# Patient Record
Sex: Female | Born: 1965
Health system: Southern US, Community
[De-identification: ages and names within clinical notes are randomized; demographics above are authoritative.]

## PROBLEM LIST (undated history)

## (undated) DIAGNOSIS — J329 Chronic sinusitis, unspecified: Secondary | ICD-10-CM

## (undated) DIAGNOSIS — D649 Anemia, unspecified: Secondary | ICD-10-CM

## (undated) DIAGNOSIS — N814 Uterovaginal prolapse, unspecified: Secondary | ICD-10-CM

## (undated) DIAGNOSIS — E785 Hyperlipidemia, unspecified: Secondary | ICD-10-CM

## (undated) DIAGNOSIS — E782 Mixed hyperlipidemia: Secondary | ICD-10-CM

## (undated) DIAGNOSIS — M1990 Unspecified osteoarthritis, unspecified site: Secondary | ICD-10-CM

## (undated) DIAGNOSIS — N393 Stress incontinence (female) (male): Secondary | ICD-10-CM

## (undated) HISTORY — PX: TONSILLECTOMY: SUR1361

## (undated) HISTORY — DX: Mixed hyperlipidemia: E78.2

## (undated) HISTORY — DX: Chronic sinusitis, unspecified: J32.9

## (undated) HISTORY — DX: Hyperlipidemia, unspecified: E78.5

## (undated) HISTORY — DX: Uterovaginal prolapse, unspecified: N81.4

## (undated) HISTORY — PX: NECK LESION BIOPSY: SHX2078

## (undated) HISTORY — DX: Anemia, unspecified: D64.9

## (undated) HISTORY — DX: Stress incontinence (female) (male): N39.3

## (undated) HISTORY — DX: Unspecified osteoarthritis, unspecified site: M19.90

---

## 1997-12-27 ENCOUNTER — Inpatient Hospital Stay (HOSPITAL_COMMUNITY): Admission: AD | Admit: 1997-12-27 | Discharge: 1997-12-27 | Payer: Self-pay | Admitting: Obstetrics and Gynecology

## 1998-03-21 ENCOUNTER — Inpatient Hospital Stay (HOSPITAL_COMMUNITY): Admission: AD | Admit: 1998-03-21 | Discharge: 1998-03-22 | Payer: Self-pay | Admitting: Obstetrics and Gynecology

## 1999-12-21 ENCOUNTER — Other Ambulatory Visit: Admission: RE | Admit: 1999-12-21 | Discharge: 1999-12-21 | Payer: Self-pay | Admitting: Obstetrics and Gynecology

## 2000-04-22 ENCOUNTER — Inpatient Hospital Stay (HOSPITAL_COMMUNITY): Admission: AD | Admit: 2000-04-22 | Discharge: 2000-04-22 | Payer: Self-pay | Admitting: Obstetrics and Gynecology

## 2000-07-08 ENCOUNTER — Inpatient Hospital Stay (HOSPITAL_COMMUNITY): Admission: AD | Admit: 2000-07-08 | Discharge: 2000-07-09 | Payer: Self-pay | Admitting: Obstetrics & Gynecology

## 2001-03-25 ENCOUNTER — Encounter: Payer: Self-pay | Admitting: Emergency Medicine

## 2001-03-25 ENCOUNTER — Emergency Department (HOSPITAL_COMMUNITY): Admission: EM | Admit: 2001-03-25 | Discharge: 2001-03-25 | Payer: Self-pay | Admitting: Emergency Medicine

## 2001-11-27 ENCOUNTER — Encounter: Payer: Self-pay | Admitting: Surgery

## 2001-11-27 ENCOUNTER — Encounter: Admission: RE | Admit: 2001-11-27 | Discharge: 2001-11-27 | Payer: Self-pay | Admitting: Surgery

## 2001-12-15 ENCOUNTER — Other Ambulatory Visit: Admission: RE | Admit: 2001-12-15 | Discharge: 2001-12-15 | Payer: Self-pay | Admitting: Surgery

## 2004-04-30 ENCOUNTER — Other Ambulatory Visit: Admission: RE | Admit: 2004-04-30 | Discharge: 2004-04-30 | Payer: Self-pay | Admitting: Gynecology

## 2004-09-01 ENCOUNTER — Encounter: Admission: RE | Admit: 2004-09-01 | Discharge: 2004-09-01 | Payer: Self-pay | Admitting: Gynecology

## 2011-09-03 ENCOUNTER — Other Ambulatory Visit: Payer: Self-pay | Admitting: *Deleted

## 2011-09-06 ENCOUNTER — Other Ambulatory Visit: Payer: Self-pay | Admitting: Obstetrics and Gynecology

## 2011-09-06 ENCOUNTER — Other Ambulatory Visit (HOSPITAL_COMMUNITY)
Admission: RE | Admit: 2011-09-06 | Discharge: 2011-09-06 | Disposition: A | Discharge status: Home / Self Care | Payer: Self-pay | Source: Ambulatory Visit | Attending: Obstetrics and Gynecology | Admitting: Obstetrics and Gynecology

## 2011-09-06 DIAGNOSIS — Z01419 Encounter for gynecological examination (general) (routine) without abnormal findings: Secondary | ICD-10-CM | POA: Insufficient documentation

## 2011-09-21 ENCOUNTER — Other Ambulatory Visit: Payer: Self-pay | Admitting: Obstetrics and Gynecology

## 2011-09-21 DIAGNOSIS — Z1231 Encounter for screening mammogram for malignant neoplasm of breast: Secondary | ICD-10-CM

## 2011-10-19 ENCOUNTER — Ambulatory Visit
Admission: RE | Admit: 2011-10-19 | Discharge: 2011-10-19 | Disposition: A | Discharge status: Home / Self Care | Payer: Self-pay | Source: Ambulatory Visit | Attending: Obstetrics and Gynecology | Admitting: Obstetrics and Gynecology

## 2011-10-19 DIAGNOSIS — Z1231 Encounter for screening mammogram for malignant neoplasm of breast: Secondary | ICD-10-CM

## 2014-12-18 LAB — LIPID PANEL
CHOLESTEROL: 286 — AB (ref 0–200)
HDL: 62 (ref 35–70)
LDL CALC: 224
TRIGLYCERIDES: 243 — AB (ref 40–160)

## 2014-12-18 LAB — BASIC METABOLIC PANEL
BUN: 7 (ref 4–21)
Creatinine: 0.7 (ref <=1.1)
GLUCOSE: 86
Potassium: 4.1 (ref 3.4–5.3)
SODIUM: 139 (ref 137–147)

## 2014-12-18 LAB — CBC AND DIFFERENTIAL
HEMATOCRIT: 38 (ref 36–46)
Hemoglobin: 12.2 (ref 12.0–16.0)
PLATELETS: 293 (ref 150–399)
WBC: 8.4

## 2014-12-18 LAB — TSH: TSH: 1.81 (ref <=5.90)

## 2014-12-18 LAB — VITAMIN D 25 HYDROXY (VIT D DEFICIENCY, FRACTURES): Vit D, 25-Hydroxy: 7.3

## 2016-11-22 HISTORY — PX: ABDOMINAL HYSTERECTOMY: SHX81

## 2017-01-15 DIAGNOSIS — Z136 Encounter for screening for cardiovascular disorders: Secondary | ICD-10-CM | POA: Diagnosis not present

## 2017-01-15 DIAGNOSIS — Z713 Dietary counseling and surveillance: Secondary | ICD-10-CM | POA: Diagnosis not present

## 2017-01-15 DIAGNOSIS — Z13220 Encounter for screening for lipoid disorders: Secondary | ICD-10-CM | POA: Diagnosis not present

## 2017-06-07 ENCOUNTER — Other Ambulatory Visit: Payer: Self-pay | Admitting: Obstetrics and Gynecology

## 2017-06-07 ENCOUNTER — Other Ambulatory Visit (HOSPITAL_COMMUNITY)
Admission: RE | Admit: 2017-06-07 | Discharge: 2017-06-07 | Disposition: A | Discharge status: Home / Self Care | Payer: BLUE CROSS/BLUE SHIELD | Source: Ambulatory Visit | Attending: Obstetrics and Gynecology | Admitting: Obstetrics and Gynecology

## 2017-06-07 DIAGNOSIS — Z124 Encounter for screening for malignant neoplasm of cervix: Secondary | ICD-10-CM | POA: Insufficient documentation

## 2017-06-07 DIAGNOSIS — N814 Uterovaginal prolapse, unspecified: Secondary | ICD-10-CM | POA: Diagnosis not present

## 2017-06-08 ENCOUNTER — Other Ambulatory Visit: Payer: Self-pay | Admitting: Nurse Practitioner

## 2017-06-08 DIAGNOSIS — Z1231 Encounter for screening mammogram for malignant neoplasm of breast: Secondary | ICD-10-CM

## 2017-06-09 LAB — CYTOLOGY - PAP
Diagnosis: NEGATIVE
HPV: NOT DETECTED

## 2017-06-16 ENCOUNTER — Ambulatory Visit
Admission: RE | Admit: 2017-06-16 | Discharge: 2017-06-16 | Disposition: A | Discharge status: Home / Self Care | Payer: BLUE CROSS/BLUE SHIELD | Source: Ambulatory Visit | Attending: Nurse Practitioner | Admitting: Nurse Practitioner

## 2017-06-16 ENCOUNTER — Other Ambulatory Visit: Payer: Self-pay | Admitting: Obstetrics and Gynecology

## 2017-06-16 DIAGNOSIS — Z1231 Encounter for screening mammogram for malignant neoplasm of breast: Secondary | ICD-10-CM

## 2017-06-22 DIAGNOSIS — N8111 Cystocele, midline: Secondary | ICD-10-CM | POA: Diagnosis not present

## 2017-06-22 DIAGNOSIS — N814 Uterovaginal prolapse, unspecified: Secondary | ICD-10-CM | POA: Diagnosis not present

## 2017-08-03 DIAGNOSIS — N8189 Other female genital prolapse: Secondary | ICD-10-CM | POA: Diagnosis not present

## 2017-08-03 DIAGNOSIS — N814 Uterovaginal prolapse, unspecified: Secondary | ICD-10-CM | POA: Diagnosis not present

## 2017-08-03 DIAGNOSIS — N393 Stress incontinence (female) (male): Secondary | ICD-10-CM | POA: Diagnosis not present

## 2017-08-04 DIAGNOSIS — N814 Uterovaginal prolapse, unspecified: Secondary | ICD-10-CM

## 2017-08-04 DIAGNOSIS — N393 Stress incontinence (female) (male): Secondary | ICD-10-CM

## 2017-08-04 HISTORY — DX: Uterovaginal prolapse, unspecified: N81.4

## 2017-08-04 HISTORY — DX: Stress incontinence (female) (male): N39.3

## 2017-08-18 DIAGNOSIS — N72 Inflammatory disease of cervix uteri: Secondary | ICD-10-CM | POA: Diagnosis not present

## 2017-08-18 DIAGNOSIS — N393 Stress incontinence (female) (male): Secondary | ICD-10-CM | POA: Diagnosis not present

## 2017-08-18 DIAGNOSIS — N814 Uterovaginal prolapse, unspecified: Secondary | ICD-10-CM | POA: Diagnosis not present

## 2017-08-18 DIAGNOSIS — N812 Incomplete uterovaginal prolapse: Secondary | ICD-10-CM | POA: Diagnosis not present

## 2017-08-18 DIAGNOSIS — N880 Leukoplakia of cervix uteri: Secondary | ICD-10-CM | POA: Diagnosis not present

## 2017-08-18 DIAGNOSIS — M6208 Separation of muscle (nontraumatic), other site: Secondary | ICD-10-CM | POA: Diagnosis not present

## 2017-08-18 DIAGNOSIS — N879 Dysplasia of cervix uteri, unspecified: Secondary | ICD-10-CM | POA: Diagnosis not present

## 2017-08-19 DIAGNOSIS — N393 Stress incontinence (female) (male): Secondary | ICD-10-CM | POA: Diagnosis not present

## 2017-08-19 DIAGNOSIS — N814 Uterovaginal prolapse, unspecified: Secondary | ICD-10-CM | POA: Diagnosis not present

## 2018-01-13 DIAGNOSIS — Z6833 Body mass index (BMI) 33.0-33.9, adult: Secondary | ICD-10-CM | POA: Diagnosis not present

## 2018-01-13 DIAGNOSIS — Z13220 Encounter for screening for lipoid disorders: Secondary | ICD-10-CM | POA: Diagnosis not present

## 2018-01-13 DIAGNOSIS — Z136 Encounter for screening for cardiovascular disorders: Secondary | ICD-10-CM | POA: Diagnosis not present

## 2018-01-13 DIAGNOSIS — Z713 Dietary counseling and surveillance: Secondary | ICD-10-CM | POA: Diagnosis not present

## 2018-08-29 ENCOUNTER — Other Ambulatory Visit: Payer: Self-pay

## 2018-08-29 ENCOUNTER — Encounter: Payer: Self-pay | Admitting: Family Medicine

## 2018-08-29 ENCOUNTER — Ambulatory Visit (INDEPENDENT_AMBULATORY_CARE_PROVIDER_SITE_OTHER): Payer: BLUE CROSS/BLUE SHIELD | Admitting: Family Medicine

## 2018-08-29 VITALS — BP 118/82 | HR 89 | Temp 98.0°F | Ht 63.5 in | Wt 185.6 lb

## 2018-08-29 DIAGNOSIS — Z8249 Family history of ischemic heart disease and other diseases of the circulatory system: Secondary | ICD-10-CM | POA: Insufficient documentation

## 2018-08-29 DIAGNOSIS — E782 Mixed hyperlipidemia: Secondary | ICD-10-CM | POA: Diagnosis not present

## 2018-08-29 DIAGNOSIS — M7711 Lateral epicondylitis, right elbow: Secondary | ICD-10-CM

## 2018-08-29 DIAGNOSIS — R0789 Other chest pain: Secondary | ICD-10-CM

## 2018-08-29 HISTORY — DX: Mixed hyperlipidemia: E78.2

## 2018-08-29 NOTE — Patient Instructions (Addendum)
Please return in 1 month for your annual complete physical; please come fasting. May return sooner if needed to readdress elbow pain. Ice the elbow 3x/day for about 10 minutes.  Wear a wrist splint daily for 2 weeks.  Take alleve 2 caps twice a day with food for 2 weeks.   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   It was a pleasure meeting you today! Thank you for choosing [place] to meet your healthcare needs! I truly look forward to working with you. If you have any questions or concerns, please send me a message via Mychart or call the office at [phone].   Tennis Elbow Tennis elbow (lateral epicondylitis) is inflammation of the outer tendons of your forearm close to your elbow. Your tendons attach your muscles to your bones. The outer tendons of your forearm are used to extend your wrist, and they attach on the outside part of your elbow. Tennis elbow is often found in people who play tennis, but anyone may get the condition from repeatedly extending the wrist or turning the forearm. What are the causes? This condition is caused by repeatedly extending your wrist and using your hands. It can result from sports or work that requires repetitive forearm movements. Tennis elbow may also be caused by an injury. What increases the risk? You have a higher risk of developing tennis elbow if you play tennis or another racquet sport. You also have a higher risk if you frequently use your hands for work. This condition is also more likely to develop in:  Musicians.  Carpenters, painters, and plumbers.  Cooks.  Cashiers.  People who work in [employer].  Architect workers.  Butchers.  People who use computers.  What are the signs or symptoms? Symptoms of this condition include:  Pain and tenderness in your forearm and the outer part of your elbow. You may only feel the pain when you use your arm, or you may feel it even when you are not using your arm.  A burning  feeling that runs from your elbow through your arm.  Weak grip in your hands.  How is this diagnosed? This condition may be diagnosed by medical history and physical exam. You may also have other tests, including:  X-rays.  MRI.  How is this treated? Your health care provider will recommend lifestyle adjustments, such as resting and icing your arm. Treatment may also include:  Medicines for inflammation. This may include shots of cortisone if your pain continues.  Physical therapy. This may include massage or exercises.  An elbow brace.  Surgery may eventually be recommended if your pain does not go away with treatment. Follow these instructions at home: Activity  Rest your elbow and wrist as directed by your health care provider. Try to avoid any activities that caused the problem until your health care provider says that you can do them again.  If a physical therapist teaches you exercises, do all of them as directed.  If you lift an object, lift it with your palm facing upward. This lowers the stress on your elbow. Lifestyle  If your tennis elbow is caused by sports, check your equipment and make sure that: ? You are using it correctly. ? It is the best fit for you.  If your tennis elbow is caused by work, take breaks frequently, if you are able. Talk with your manager about how to best perform tasks in a way that is safe. ? If your tennis elbow  is caused by computer use, talk with your manager about any changes that can be made to your work environment. General instructions  If directed, apply ice to the painful area: ? Put ice in a plastic bag. ? Place a towel between your skin and the bag. ? Leave the ice on for 20 minutes, 2-3 times per day.  Take medicines only as directed by your health care provider.  If you were given a brace, wear it as directed by your health care provider.  Keep all follow-up visits as directed by your health care provider. This is  important. Contact a health care provider if:  Your pain does not get better with treatment.  Your pain gets worse.  You have numbness or weakness in your forearm, hand, or fingers. This information is not intended to replace advice given to you by your health care provider. Make sure you discuss any questions you have with your health care provider. Document Released: 11/08/2005 Document Revised: 07/08/2016 Document Reviewed: 11/04/2014 Elsevier Interactive Patient Education  [employer].

## 2018-08-29 NOTE — Progress Notes (Signed)
Subjective  CC:  Chief Complaint  Patient presents with  . Establish Care    Transfer from [place], Dr. Harrington Challenger, last physical in 2015  . Arm Pain    Right arm pain and skin is senstive   . Chest Pain    Patient states that over the past month her chest has felt funny     HPI: Jacqueline Mcgee is a 52 y.o. female who presents to [place] at [facility] today to establish care with me as a new patient.  As above, transfer from [facility].  Has not had health maintenance visit in several years.  Feels well and prefers not to go to the doctor.  However, now ready to start taking better care of herself.  She is married with 3 grown children, the youngest just went off to college.  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  No history of mood problems.  Keeps very active with projects.  Does not take any prescription medications.  Health maintenance and screenings are due. She has the following concerns or needs:  Right elbow pain for the last 3 months and progressing.  Worse with specific maneuvers including reaching back and lifting things from the backseat of the car, adjusting her rearview mirror.  Denies injury but does keep active.  Has been working on a home renovation for her friend recently.  No wrist pain, paresthesias, numbness, tingling or weakness.  No shoulder pain.  Has not used any medications.  Chest pain x2.  Experienced 2 episodes of fleeting chest pain perhaps lasting minutes described as heaviness and feeling the sensation of her heart beating.  Perhaps a skipped beat.  No fluttering or racing.  No associated shortness of breath, nausea, vomiting or diaphoresis.  Neither episodes were exertional in nature.  She denies GERD symptoms.  No history of exertional chest pain.  She does keep active.  She has a family history of premature cardiovascular disease, maternal grandfather died at age 62.  She does not have known diabetes,  hypertension but has been diagnosed with hyperlipidemia, never on medications.  She is overweight.  Diet is fair.  No exercise.  She admits to having her stress load over the last month or 2, mainly work-related.  No panic attacks.  Depression screen is negative  Assessment  1. Atypical chest pain   2. Mixed hyperlipidemia   3. Family history of premature CAD   4. Lateral epicondylitis of right elbow      Plan   Atypical chest pain: History most consistent with possible skipped beat or stress.  EKG is reassuring.  No comparison.  Will return for physical and cardiovascular risk evaluation.  For now will monitor.  Return for recurring symptoms.  Recommend low-fat heart healthy diet.  Stress reduction  Right lateral epicondylitis: Educated on etiology and treatment options.  Start with wrist splint, ice, NSAIDs and rest.  Recheck in 2 to 4 weeks.  Educational handout provided.  Due to overuse.  Return for health maintenance visit with lab work.  Immunizations due: Tdap and flu shot.  Check old records first.  Educated on young children and may have grandchildren in her near future, recommend both vaccinations.  Patient will consider  Follow up:  No follow-ups on file. Orders Placed This Encounter  Procedures  . EKG 12-Lead   No orders of the defined types were placed in this encounter.    Depression screen [facility] 2/9  08/29/2018  Decreased Interest 0  Down, Depressed, Hopeless 0  PHQ - 2 Score 0    We updated and reviewed the patient's past history in detail and it is documented below.  Patient Active Problem List   Diagnosis Date Noted  . Mixed hyperlipidemia 08/29/2018  . Family history of premature CAD 08/29/2018    Paternal GF, 5s; father had early death at 68 so uncertain of his cardiac status    Health Maintenance  Topic Date Due  . HIV Screening  06/21/1983  . TETANUS/TDAP  06/21/1987  . MAMMOGRAM  06/16/2018  . COLONOSCOPY  06/20/2018  . INFLUENZA VACCINE  06/22/2018      There is no immunization history on file for this patient. No outpatient medications have been marked as taking for the 08/29/18 encounter (Office Visit) with Leamon Arnt, MD.    Allergies: Patient is allergic to sulfa antibiotics. Past Medical History Patient  has a past medical history of Arthritis, Hyperlipidemia, Mixed hyperlipidemia (08/29/2018), SUI (stress urinary incontinence, female) (08/04/2017), and Uterine prolapse (08/04/2017). Past Surgical History Patient  has a past surgical history that includes Cesarean section and Abdominal hysterectomy (2018). Family History: Patient family history includes Healthy in her daughter, son, and son; Heart disease in her paternal grandfather; Melanoma in her daughter. Social History:  Patient  reports that she has never smoked. She has never used smokeless tobacco. She reports that she drinks alcohol. She reports that she has current or past drug history.  Review of Systems: Constitutional: negative for fever or malaise Ophthalmic: negative for photophobia, double vision or loss of vision Cardiovascular: POSITIVE for chest pain, NEG dyspnea on exertion, or new LE swelling Respiratory: negative for SOB or persistent cough Gastrointestinal: negative for abdominal pain, change in bowel habits or melena Genitourinary: negative for dysuria or gross hematuria Musculoskeletal: negative for new gait disturbance or muscular weakness Integumentary: negative for new or persistent rashes Neurological: negative for TIA or stroke symptoms Psychiatric: negative for SI or delusions Allergic/Immunologic: negative for hives  Patient Care Team    Relationship Specialty Notifications Start End  Leamon Arnt, MD PCP - General Family Medicine  08/29/18     Objective  Vitals: BP 118/82   Pulse 89   Temp 98 F (36.7 C)   Ht 5' 3.5" (1.613 m)   Wt 185 lb 9.6 oz (84.2 kg)   SpO2 98%   BMI 32.36 kg/m  General:  Well developed, well nourished,  no acute distress  Psych:  Alert and oriented,normal mood and affect HEENT:  Normocephalic, atraumatic, non-icteric sclera, PERRL,  Cardiovascular:  RRR without gallop, rub or murmur, nondisplaced PMI Respiratory:  Good breath sounds bilaterally, CTAB with normal respiratory effort Gastrointestinal: normal bowel sounds, soft, non-tender, no noted masses. No HSM MSK: no deformities, contusions. Joints are without erythema or swelling, right elbow tenderness over epicondyles, pain with resisted supination.  Negative Phalen's.  Normal wrist and shoulder exam. Skin:  Warm, no rashes or suspicious lesions noted Neurologic:    Mental status is normal. Gross motor and sensory exams are normal. Normal gait EKG: Normal sinus rhythm low voltage in precordial leads.  No comparison.  No Q waves.  No ST elevation.  Commons side effects, risks, benefits, and alternatives for medications and treatment plan prescribed today were discussed, and the patient expressed understanding of the given instructions. Patient is instructed to call or message via MyChart if he/she has any questions or concerns regarding our treatment plan. No barriers to understanding were  identified. We discussed Red Flag symptoms and signs in detail. Patient expressed understanding regarding what to do in case of urgent or emergency type symptoms.   Medication list was reconciled, printed and provided to the patient in AVS. Patient instructions and summary information was reviewed with the patient as documented in the AVS. This note was prepared with assistance of Dragon voice recognition software. Occasional wrong-word or sound-a-like substitutions may have occurred due to the inherent limitations of voice recognition software

## 2018-09-13 ENCOUNTER — Encounter: Payer: Self-pay | Admitting: Emergency Medicine

## 2018-09-25 ENCOUNTER — Other Ambulatory Visit: Payer: Self-pay

## 2018-09-25 ENCOUNTER — Encounter: Payer: Self-pay | Admitting: Family Medicine

## 2018-09-25 ENCOUNTER — Ambulatory Visit (INDEPENDENT_AMBULATORY_CARE_PROVIDER_SITE_OTHER): Payer: BLUE CROSS/BLUE SHIELD | Admitting: Family Medicine

## 2018-09-25 VITALS — BP 118/76 | HR 81 | Temp 98.6°F | Ht 63.5 in | Wt 182.4 lb

## 2018-09-25 DIAGNOSIS — Z0000 Encounter for general adult medical examination without abnormal findings: Secondary | ICD-10-CM

## 2018-09-25 DIAGNOSIS — Z8249 Family history of ischemic heart disease and other diseases of the circulatory system: Secondary | ICD-10-CM | POA: Diagnosis not present

## 2018-09-25 DIAGNOSIS — Z1212 Encounter for screening for malignant neoplasm of rectum: Secondary | ICD-10-CM

## 2018-09-25 DIAGNOSIS — E782 Mixed hyperlipidemia: Secondary | ICD-10-CM | POA: Diagnosis not present

## 2018-09-25 DIAGNOSIS — M7711 Lateral epicondylitis, right elbow: Secondary | ICD-10-CM

## 2018-09-25 DIAGNOSIS — Z1211 Encounter for screening for malignant neoplasm of colon: Secondary | ICD-10-CM

## 2018-09-25 LAB — CBC WITH DIFFERENTIAL/PLATELET
BASOS ABS: 0 10*3/uL (ref 0.0–0.1)
Basophils Relative: 0.6 % (ref 0.0–3.0)
EOS ABS: 0.2 10*3/uL (ref 0.0–0.7)
EOS PCT: 2.5 % (ref 0.0–5.0)
HCT: 41.9 % (ref 36.0–46.0)
HEMOGLOBIN: 14 g/dL (ref 12.0–15.0)
LYMPHS ABS: 1.9 10*3/uL (ref 0.7–4.0)
Lymphocytes Relative: 25.7 % (ref 12.0–46.0)
MCHC: 33.5 g/dL (ref 30.0–36.0)
MCV: 89.8 fl (ref 78.0–100.0)
MONO ABS: 0.5 10*3/uL (ref 0.1–1.0)
Monocytes Relative: 6.6 % (ref 3.0–12.0)
NEUTROS PCT: 64.6 % (ref 43.0–77.0)
Neutro Abs: 4.8 10*3/uL (ref 1.4–7.7)
Platelets: 313 10*3/uL (ref 150.0–400.0)
RBC: 4.67 Mil/uL (ref 3.87–5.11)
RDW: 13.1 % (ref 11.5–15.5)
WBC: 7.4 10*3/uL (ref 4.0–10.5)

## 2018-09-25 LAB — LIPID PANEL
Cholesterol: 279 mg/dL — ABNORMAL HIGH (ref 0–200)
HDL: 55 mg/dL (ref >39.00)
LDL Cholesterol: 193 mg/dL — ABNORMAL HIGH (ref 0–99)
NONHDL: 223.56
Total CHOL/HDL Ratio: 5
Triglycerides: 154 mg/dL — ABNORMAL HIGH (ref 0.0–149.0)
VLDL: 30.8 mg/dL (ref 0.0–40.0)

## 2018-09-25 LAB — COMPREHENSIVE METABOLIC PANEL
ALT: 12 U/L (ref 0–35)
AST: 17 U/L (ref 0–37)
Albumin: 4.7 g/dL (ref 3.5–5.2)
Alkaline Phosphatase: 72 U/L (ref 39–117)
BILIRUBIN TOTAL: 0.5 mg/dL (ref 0.2–1.2)
BUN: 10 mg/dL (ref 6–23)
CO2: 25 mEq/L (ref 19–32)
CREATININE: 0.77 mg/dL (ref 0.40–1.20)
Calcium: 9.8 mg/dL (ref 8.4–10.5)
Chloride: 103 mEq/L (ref 96–112)
GFR: 84.25 mL/min (ref >60.00)
GLUCOSE: 96 mg/dL (ref 70–99)
POTASSIUM: 3.8 meq/L (ref 3.5–5.1)
SODIUM: 138 meq/L (ref 135–145)
TOTAL PROTEIN: 7.5 g/dL (ref 6.0–8.3)

## 2018-09-25 LAB — TSH: TSH: 1.67 u[IU]/mL (ref 0.35–4.50)

## 2018-09-25 NOTE — Progress Notes (Signed)
Subjective  Chief Complaint  Patient presents with  . Annual Exam    doing well, no complaints. has form for insurance, patient is fasting, declines flu today     HPI: Jacqueline Mcgee is a 52 y.o. female who presents to [place] at [facility] today for a Female Wellness Visit. She also has the concerns and/or needs as listed above in the chief complaint. These will be addressed in addition to the Health Maintenance Visit.   Wellness Visit: annual visit with health maintenance review and exam without Pap   Fasting for labs. No more chest pain. (see last visit). Feels fine. Endorses left knee pain intermittently w/o redness, warmth or swelling. Thinks it may be OA; not ready to have it looked at just yet.   Declines influenza vaccine this year.   Lateral epicondylitis: improved.  Assessment  1. Annual physical exam   2. Mixed hyperlipidemia   3. Family history of premature CAD   4. Lateral epicondylitis of right elbow      Plan  Female Wellness Visit:  Age appropriate Health Maintenance and Prevention measures were discussed with patient. Included topics are cancer screening recommendations, ways to keep healthy (see AVS) including dietary and exercise recommendations, regular eye and dental care, use of seat belts, and avoidance of moderate alcohol use and tobacco use. mammo ordered. Referred for colonoscopy.   BMI: discussed patient's BMI and encouraged positive lifestyle modifications to help get to or maintain a target BMI.  HM needs and immunizations were addressed and ordered. See below for orders. See HM and immunization section for updates.  Routine labs and screening tests ordered including cmp, cbc and lipids where appropriate.  Discussed recommendations regarding Vit D and calcium supplementation (see AVS)  rec tennis elbow strap as needed for prevention  Knee pain: discussed supportive care and further evaluation if worsens or when she  is ready.    Follow up: No follow-ups on file.  Orders Placed This Encounter  Procedures  . MM Digital Screening  . CBC with Differential/Platelet  . Comprehensive metabolic panel  . Lipid panel  . HIV Antibody (routine testing w rflx)  . TSH   No orders of the defined types were placed in this encounter.     Lifestyle: Body mass index is 31.8 kg/m. Wt Readings from Last 3 Encounters:  09/25/18 182 lb 6.4 oz (82.7 kg)  08/29/18 185 lb 9.6 oz (84.2 kg)   Diet: general Exercise: intermittently,   Patient Active Problem List   Diagnosis Date Noted  . Mixed hyperlipidemia 08/29/2018  . Family history of premature CAD 08/29/2018    Paternal GF, 53s; father had early death at 39 so uncertain of his cardiac status    Health Maintenance  Topic Date Due  . HIV Screening  06/21/1983  . MAMMOGRAM  06/16/2018  . COLONOSCOPY  06/20/2018  . INFLUENZA VACCINE  06/22/2018  . TETANUS/TDAP  12/18/2024   Immunization History  Administered Date(s) Administered  . Tdap 12/18/2014   We updated and reviewed the patient's past history in detail and it is documented below. Allergies: Patient is allergic to sulfa antibiotics. Past Medical History Patient  has a past medical history of Arthritis, Hyperlipidemia, Mixed hyperlipidemia (08/29/2018), SUI (stress urinary incontinence, female) (08/04/2017), and Uterine prolapse (08/04/2017). Past Surgical History Patient  has a past surgical history that includes Cesarean section and Abdominal hysterectomy (2018). Family History: Patient family history includes Arthritis in her mother; Depression in her brother; Healthy in  her daughter, son, and son; Heart disease in her paternal grandfather; Melanoma in her daughter. Social History:  Patient  reports that she has never smoked. She has never used smokeless tobacco. She reports that she drinks alcohol. She reports that she has current or past drug history.  Review of Systems: Constitutional:  negative for fever or malaise Ophthalmic: negative for photophobia, double vision or loss of vision Cardiovascular: negative for chest pain, dyspnea on exertion, or new LE swelling Respiratory: negative for SOB or persistent cough Gastrointestinal: negative for abdominal pain, change in bowel habits or melena Genitourinary: negative for dysuria or gross hematuria, no abnormal uterine bleeding or disharge Musculoskeletal: negative for new gait disturbance or muscular weakness Integumentary: negative for new or persistent rashes, no breast lumps Neurological: negative for TIA or stroke symptoms Psychiatric: negative for SI or delusions Allergic/Immunologic: negative for hives  Patient Care Team    Relationship Specialty Notifications Start End  Leamon Arnt, MD PCP - General Family Medicine  08/29/18     Objective  Vitals: BP 118/76   Pulse 81   Temp 98.6 F (37 C)   Ht 5' 3.5" (1.613 m)   Wt 182 lb 6.4 oz (82.7 kg)   SpO2 98%   BMI 31.80 kg/m  General:  Well developed, well nourished, no acute distress  Psych:  Alert and orientedx3,normal mood and affect HEENT:  Normocephalic, atraumatic, non-icteric sclera, PERRL, oropharynx is clear without mass or exudate, supple neck without adenopathy, mass or thyromegaly Cardiovascular:  Normal S1, S2, RRR without gallop, rub or murmur, nondisplaced PMI Respiratory:  Good breath sounds bilaterally, CTAB with normal respiratory effort Gastrointestinal: normal bowel sounds, soft, non-tender, no noted masses. No HSM MSK: no deformities, contusions. Joints are without erythema or swelling. Spine and CVA region are nontender, left knee with mild crepitus, o/w nl exam Skin:  Warm, no rashes or suspicious lesions noted Neurologic:    Mental status is normal. CN 2-11 are normal. Gross motor and sensory exams are normal. Normal gait. No tremor Breast Exam: No mass, skin retraction or nipple discharge is appreciated in either breast. No axillary  adenopathy. Fibrocystic changes are not noted    Commons side effects, risks, benefits, and alternatives for medications and treatment plan prescribed today were discussed, and the patient expressed understanding of the given instructions. Patient is instructed to call or message via MyChart if he/she has any questions or concerns regarding our treatment plan. No barriers to understanding were identified. We discussed Red Flag symptoms and signs in detail. Patient expressed understanding regarding what to do in case of urgent or emergency type symptoms.   Medication list was reconciled, printed and provided to the patient in AVS. Patient instructions and summary information was reviewed with the patient as documented in the AVS. This note was prepared with assistance of Dragon voice recognition software. Occasional wrong-word or sound-a-like substitutions may have occurred due to the inherent limitations of voice recognition software

## 2018-09-25 NOTE — Patient Instructions (Signed)
Please return in 12 months for your annual complete physical; please come fasting. Sooner if you have problems with your elbow or knee.   We will call you with information regarding your referral appointment. Mammogram and colonoscopy.  If you do not hear from [place] within the next 2 weeks, please let me know. It can take 1-2 weeks to get appointments set up with the specialists.    If you have any questions or concerns, please don't hesitate to send me a message via MyChart or call the office at [phone]. Thank you for visiting with [place] today! It's our pleasure caring for you.  Recommendations for women to keep healthy:   EXERCISE AND DIET: We recommended that you start or continue a regular exercise program for good health. Regular exercise means any activity that makes your heart beat faster and makes you sweat. We recommend exercising at least 30 minutes per day at least 3 days a week, preferably 4 or 5. We also recommend a diet low in fat and sugar. Inactivity, poor dietary choices and obesity can cause diabetes, heart attack, stroke, and kidney damage, among others.   ALCOHOL AND SMOKING: Women should limit their alcohol intake to no more than 7 drinks/beers/glasses of wine (combined, not each!) per week. Moderation of alcohol intake to this level decreases your risk of breast cancer and liver damage. And of course, no recreational drugs are part of a healthy lifestyle. And absolutely no smoking or even second hand smoke. Most people know smoking can cause heart and lung diseases, but did you know it also contributes to weakening of your bones? Aging of your skin? Yellowing of your teeth and nails?  CALCIUM AND VITAMIN D: Adequate intake of calcium and Vitamin D are recommended. The recommendations for exact amounts of these supplements seem to change often, but generally speaking 600 mg of calcium (either carbonate or citrate) and 800 units of Vitamin D per day seems prudent. Certain women may  benefit from higher intake of Vitamin D. If you are among these women, your doctor will have told you during your visit.   PAP SMEARS: Pap smears, to check for cervical cancer or precancers, have traditionally been done yearly, although recent scientific advances have shown that most women can have pap smears less often. However, every woman still should have a physical exam from her gynecologist every year. It will include a breast check, inspection of the vulva and vagina to check for abnormal growths or skin changes, a visual exam of the cervix, and then an exam to evaluate the size and shape of the uterus and ovaries. And after 50 years of age, a rectal exam is indicated to check for rectal cancers. We will also provide age appropriate advice regarding health maintenance, like when you should have certain vaccines, screening for sexually transmitted diseases, bone density testing, colonoscopy, mammograms, etc.   MAMMOGRAMS: All women over 50 years old should have a yearly mammogram. Many facilities now offer a "3D" mammogram, which may cost around $50 extra out of pocket. If possible, we recommend you accept the option to have the 3D mammogram performed. It both reduces the number of women who will be called back for extra views which then turn out to be normal, and it is better than the routine mammogram at detecting truly abnormal areas.   COLONOSCOPY: Colonoscopy to screen for colon cancer is recommended for all women at age 74. We know, you hate the idea of the prep. We agree, BUT,  having colon cancer and not knowing it is worse!! Colon cancer so often starts as a polyp that can be seen and removed at colonscopy, which can quite literally save your life! And if your first colonoscopy is normal and you have no family history of colon cancer, most women don't have to have it again for 10 years. Once every ten years, you can do something that may end up saving your life, right? We will be happy to help  you get it scheduled when you are ready. Be sure to check your insurance coverage so you understand how much it will cost. It may be covered as a preventative service at no cost, but you should check your particular policy.     Osteoarthritis Osteoarthritis is a type of arthritis that affects tissue that covers the ends of bones in joints (cartilage). Cartilage acts as a cushion between the bones and helps them move smoothly. Osteoarthritis results when cartilage in the joints gets worn down. Osteoarthritis is sometimes called "wear and tear" arthritis. Osteoarthritis is the most common form of arthritis. It often occurs in older people. It is a condition that gets worse over time (a progressive condition). Joints that are most often affected by this condition are in:  Fingers.  Toes.  Hips.  Knees.  Spine, including neck and lower back.  What are the causes? This condition is caused by age-related wearing down of cartilage that covers the ends of bones. What increases the risk? The following factors may make you more likely to develop this condition:  Older age.  Being overweight or obese.  Overuse of joints, such as in athletes.  Past injury of a joint.  Past surgery on a joint.  Family history of osteoarthritis.  What are the signs or symptoms? The main symptoms of this condition are pain, swelling, and stiffness in the joint. The joint may lose its shape over time. Small pieces of bone or cartilage may break off and float inside of the joint, which may cause more pain and damage to the joint. Small deposits of bone (osteophytes) may grow on the edges of the joint. Other symptoms may include:  A grating or scraping feeling inside the joint when you move it.  Popping or creaking sounds when you move.  Symptoms may affect one or more joints. Osteoarthritis in a major joint, such as your knee or hip, can make it painful to walk or exercise. If you have osteoarthritis in your  hands, you might not be able to grip items, twist your hand, or control small movements of your hands and fingers (fine motor skills). How is this diagnosed? This condition may be diagnosed based on:  Your medical history.  A physical exam.  Your symptoms.  X-rays of the affected joint(s).  Blood tests to rule out other types of arthritis.  How is this treated? There is no cure for this condition, but treatment can help to control pain and improve joint function. Treatment plans may include:  A prescribed exercise program that allows for rest and joint relief. You may work with a physical therapist.  A weight control plan.  Pain relief techniques, such as: ? Applying heat and cold to the joint. ? Electric pulses delivered to nerve endings under the skin (transcutaneous electrical nerve stimulation, or TENS). ? Massage. ? Certain nutritional supplements.  NSAIDs or prescription medicines to help relieve pain.  Medicine to help relieve pain and inflammation (corticosteroids). This can be given by mouth (orally) or  as an injection.  Assistive devices, such as a brace, wrap, splint, specialized glove, or cane.  Surgery, such as: ? An osteotomy. This is done to reposition the bones and relieve pain or to remove loose pieces of bone and cartilage. ? Joint replacement surgery. You may need this surgery if you have very bad (advanced) osteoarthritis.  Follow these instructions at home: Activity  Rest your affected joints as directed by your health care provider.  Do not drive or use heavy machinery while taking prescription pain medicine.  Exercise as directed. Your health care provider or physical therapist may recommend specific types of exercise, such as: ? Strengthening exercises. These are done to strengthen the muscles that support joints that are affected by arthritis. They can be performed with weights or with exercise bands to add resistance. ? Aerobic activities. These  are exercises, such as brisk walking or water aerobics, that get your heart pumping. ? Range-of-motion activities. These keep your joints easy to move. ? Balance and agility exercises. Managing pain, stiffness, and swelling  If directed, apply heat to the affected area as often as told by your health care provider. Use the heat source that your health care provider recommends, such as a moist heat pack or a heating pad. ? If you have a removable assistive device, remove it as told by your health care provider. ? Place a towel between your skin and the heat source. If your health care provider tells you to keep the assistive device on while you apply heat, place a towel between the assistive device and the heat source. ? Leave the heat on for 20-30 minutes. ? Remove the heat if your skin turns bright red. This is especially important if you are unable to feel pain, heat, or cold. You may have a greater risk of getting burned.  If directed, put ice on the affected joint: ? If you have a removable assistive device, remove it as told by your health care provider. ? Put ice in a plastic bag. ? Place a towel between your skin and the bag. If your health care provider tells you to keep the assistive device on during icing, place a towel between the assistive device and the bag. ? Leave the ice on for 20 minutes, 2-3 times a day. General instructions  Take over-the-counter and prescription medicines only as told by your health care provider.  Maintain a healthy weight. Follow instructions from your health care provider for weight control. These may include dietary restrictions.  Do not use any products that contain nicotine or tobacco, such as cigarettes and e-cigarettes. These can delay bone healing. If you need help quitting, ask your health care provider.  Use assistive devices as directed by your health care provider.  Keep all follow-up visits as told by your health care provider. This is  important. Where to find more information:  [employer] of Arthritis and Musculoskeletal and Skin Diseases: www.niams.SouthExposed.es  [employer] on Aging: http://kim-miller.com/  American College of Rheumatology: www.rheumatology.org Contact a health care provider if:  Your skin turns red.  You develop a rash.  You have pain that gets worse.  You have a fever along with joint or muscle aches. Get help right away if:  You lose a lot of weight.  You suddenly lose your appetite.  You have night sweats. Summary  Osteoarthritis is a type of arthritis that affects tissue covering the ends of bones in joints (cartilage).  This condition is caused by age-related wearing  down of cartilage that covers the ends of bones.  The main symptom of this condition is pain, swelling, and stiffness in the joint.  There is no cure for this condition, but treatment can help to control pain and improve joint function. This information is not intended to replace advice given to you by your health care provider. Make sure you discuss any questions you have with your health care provider. Document Released: 11/08/2005 Document Revised: 07/12/2016 Document Reviewed: 07/12/2016 Elsevier Interactive Patient Education  [employer].

## 2018-09-26 LAB — HIV ANTIBODY (ROUTINE TESTING W REFLEX): HIV 1&2 Ab, 4th Generation: NONREACTIVE

## 2018-09-26 NOTE — Progress Notes (Signed)
Please call patient: I have reviewed his/her lab results. Cholesterol levels are extremely elevated. Total cholesterol is 279 and LDL or bad cholesterol is 193. I recommend considering starting cholesterol lowering medication to lower these levels. Let me know if she would like to start a statin medication. She can schedule an office visit if she'd like to discuss further. A low fat diet will also help but I suspect this is partly genetic. Other labs look fine!  The 10-year ASCVD risk score Mikey Bussing DC Brooke Bonito., et al., 2013) is: 1.7%   Values used to calculate the score:     Age: 52 years     Sex: Female     Is Non-Hispanic African American: No     Diabetic: No     Tobacco smoker: No     Systolic Blood Pressure: 022 mmHg     Is BP treated: No     HDL Cholesterol: 55 mg/dL     Total Cholesterol: 279 mg/dL

## 2018-10-10 ENCOUNTER — Encounter: Payer: Self-pay | Admitting: Gastroenterology

## 2018-10-26 ENCOUNTER — Encounter: Payer: Self-pay | Admitting: Family Medicine

## 2018-10-26 ENCOUNTER — Other Ambulatory Visit: Payer: Self-pay

## 2018-10-26 ENCOUNTER — Ambulatory Visit (INDEPENDENT_AMBULATORY_CARE_PROVIDER_SITE_OTHER): Payer: BLUE CROSS/BLUE SHIELD | Admitting: Family Medicine

## 2018-10-26 VITALS — BP 120/86 | HR 115 | Temp 98.6°F | Ht 63.5 in | Wt 179.8 lb

## 2018-10-26 DIAGNOSIS — J0100 Acute maxillary sinusitis, unspecified: Secondary | ICD-10-CM

## 2018-10-26 MED ORDER — AMOXICILLIN 875 MG PO TABS: 875.0000 mg | ORAL_TABLET | Freq: Two times a day (BID) | ORAL | 0 refills | Status: AC

## 2018-10-26 NOTE — Progress Notes (Signed)
Subjective   CC:  Chief Complaint  Patient presents with  . Cough    productive with Green mucus, has been battling a cold for x 1 week     HPI: Jacqueline Mcgee is a 52 y.o. female who presents to the office today to address the problems listed above in the chief complaint.  Patient reports sinus congestion and pressure with thick drainage, mild nonproductive cough, ear pressure without pain, and mild malaise.  Symptoms have been present for several days.Started as head cold. Feeling worse today with increased malaise and fatigue.Vivia Ewing high fevers, GI symptoms, shortness of breath.  Patient is a non-smoker.  No history of asthma or COPD.  I reviewed the patients updated PMH, FH, and SocHx.    Patient Active Problem List   Diagnosis Date Noted  . Mixed hyperlipidemia 08/29/2018  . Family history of premature CAD 08/29/2018   Current Meds  Medication Sig  . S-Adenosylmethionine (SAM-E PO) Take by mouth.    Review of Systems: Cardiovascular: negative for chest pain Respiratory: negative for SOB or persistent cough Gastrointestinal: negative for abdominal pain Genitourinary: negative for dysuria or gross hematuria  Objective  Vitals: BP 120/86   Pulse (!) 115   Temp 98.6 F (37 C)   Ht 5' 3.5" (1.613 m)   Wt 179 lb 12.8 oz (81.6 kg)   SpO2 98%   BMI 31.35 kg/m  General: no acute distress  Psych:  Alert and oriented, normal mood and affect HEENT:  Normocephalic, atraumatic, TMs with serous effusions or retraction w/o erythema, nasal mucosa is red with purulent drainage, tender maxillary sinus present, OP mild erythematous w/o eudate, supple neck without LAD Cardiovascular:  RRR without murmur or gallop. no peripheral edema Respiratory:  Good breath sounds bilaterally, CTAB with normal respiratory effort Skin:  Warm, no rashes Neurologic:   Mental status is normal. normal gait  Assessment  1. Acute non-recurrent maxillary sinusitis      Plan    URI vs  secondary Sinusitis: History and exam is most consistent with URI but can't r/o sinusitis. RX for antibiotic given if needed with education.   Etiology and prognosis discussed with patient.  Recommend antibiotics as ordered below.  Patient to complete course of antibiotics, use supportive medications like mucolytics and decongestants as needed.  May use Tylenol or Advil if needed.  Symptoms should improve over the next 2 weeks.  Patient will return or call if symptoms persist or worsen.  Follow up: prn    Commons side effects, risks, benefits, and alternatives for medications and treatment plan prescribed today were discussed, and the patient expressed understanding of the given instructions. Patient is instructed to call or message via MyChart if he/she has any questions or concerns regarding our treatment plan. No barriers to understanding were identified. We discussed Red Flag symptoms and signs in detail. Patient expressed understanding regarding what to do in case of urgent or emergency type symptoms.   Medication list was reconciled, printed and provided to the patient in AVS. Patient instructions and summary information was reviewed with the patient as documented in the AVS. This note was prepared with assistance of Dragon voice recognition software. Occasional wrong-word or sound-a-like substitutions may have occurred due to the inherent limitations of voice recognition software  No orders of the defined types were placed in this encounter.  Meds ordered this encounter  Medications  . amoxicillin (AMOXIL) 875 MG tablet    Sig: Take 1 tablet (875 mg total) by  mouth 2 (two) times daily for 7 days.    Dispense:  14 tablet    Refill:  0

## 2018-10-26 NOTE — Patient Instructions (Signed)
Please follow up if symptoms do not improve or as needed.   You may use Delsym cough syrup or Mucinex DM to help with congestion and coughing.  If you are not starting to improve in the next 48-72 hours, take the antibiotic.

## 2018-11-01 ENCOUNTER — Ambulatory Visit (AMBULATORY_SURGERY_CENTER): Payer: Self-pay

## 2018-11-01 ENCOUNTER — Encounter: Payer: Self-pay | Admitting: Gastroenterology

## 2018-11-01 VITALS — Ht 62.0 in | Wt 181.4 lb

## 2018-11-01 DIAGNOSIS — J329 Chronic sinusitis, unspecified: Secondary | ICD-10-CM

## 2018-11-01 DIAGNOSIS — Z1211 Encounter for screening for malignant neoplasm of colon: Secondary | ICD-10-CM

## 2018-11-01 HISTORY — DX: Chronic sinusitis, unspecified: J32.9

## 2018-11-01 MED ORDER — NA SULFATE-K SULFATE-MG SULF 17.5-3.13-1.6 GM/177ML PO SOLN: 1.0000 | Freq: Once | ORAL | 0 refills | Status: AC

## 2018-11-01 NOTE — Progress Notes (Signed)
Per pt, no allergies to soy or egg products.Pt not taking any weight loss meds or using  O2 at home.  Pt refused emmi video. 

## 2018-11-07 ENCOUNTER — Ambulatory Visit
Admission: RE | Admit: 2018-11-07 | Discharge: 2018-11-07 | Disposition: A | Discharge status: Home / Self Care | Payer: BLUE CROSS/BLUE SHIELD | Source: Ambulatory Visit | Attending: Family Medicine | Admitting: Family Medicine

## 2018-11-07 DIAGNOSIS — Z1231 Encounter for screening mammogram for malignant neoplasm of breast: Secondary | ICD-10-CM | POA: Diagnosis not present

## 2018-11-07 DIAGNOSIS — Z0000 Encounter for general adult medical examination without abnormal findings: Secondary | ICD-10-CM

## 2018-11-13 ENCOUNTER — Encounter: Payer: BLUE CROSS/BLUE SHIELD | Admitting: Gastroenterology

## 2018-11-20 ENCOUNTER — Ambulatory Visit (AMBULATORY_SURGERY_CENTER): Payer: BLUE CROSS/BLUE SHIELD | Admitting: Gastroenterology

## 2018-11-20 ENCOUNTER — Encounter: Payer: Self-pay | Admitting: Gastroenterology

## 2018-11-20 VITALS — BP 106/71 | HR 65 | Temp 97.3°F | Resp 14 | Ht 62.0 in | Wt 181.0 lb

## 2018-11-20 DIAGNOSIS — D124 Benign neoplasm of descending colon: Secondary | ICD-10-CM

## 2018-11-20 DIAGNOSIS — Z1211 Encounter for screening for malignant neoplasm of colon: Secondary | ICD-10-CM | POA: Diagnosis not present

## 2018-11-20 DIAGNOSIS — D127 Benign neoplasm of rectosigmoid junction: Secondary | ICD-10-CM | POA: Diagnosis not present

## 2018-11-20 MED ORDER — SODIUM CHLORIDE 0.9 % IV SOLN: 500.0000 mL | Freq: Once | INTRAVENOUS | Status: DC

## 2018-11-20 NOTE — Patient Instructions (Addendum)
YOU HAD AN ENDOSCOPIC PROCEDURE TODAY AT THE [facility]:   Refer to the procedure report that was given to you for any specific questions about what was found during the examination.  If the procedure report does not answer your questions, please call your gastroenterologist to clarify.  If you requested that your care partner not be given the details of your procedure findings, then the procedure report has been included in a sealed envelope for you to review at your convenience later.  YOU SHOULD EXPECT: Some feelings of bloating in the abdomen. Passage of more gas than usual.  Walking can help get rid of the air that was put into your GI tract during the procedure and reduce the bloating. If you had a lower endoscopy (such as a colonoscopy or flexible sigmoidoscopy) you may notice spotting of blood in your stool or on the toilet paper. If you underwent a bowel prep for your procedure, you may not have a normal bowel movement for a few days.  Please Note:  You might notice some irritation and congestion in your nose or some drainage.  This is from the oxygen used during your procedure.  There is no need for concern and it should clear up in a day or so.  SYMPTOMS TO REPORT IMMEDIATELY:   Following lower endoscopy (colonoscopy or flexible sigmoidoscopy):  Excessive amounts of blood in the stool  Significant tenderness or worsening of abdominal pains  Swelling of the abdomen that is new, acute  Fever of 100F or higher  For urgent or emergent issues, a gastroenterologist can be reached at any hour by calling [phone].   DIET:  We do recommend a small meal at first, but then you may proceed to your regular diet.  Drink plenty of fluids but you should avoid alcoholic beverages for 24 hours.  ACTIVITY:  You should plan to take it easy for the rest of today and you should NOT DRIVE or use heavy machinery until tomorrow (because of the sedation medicines used during the test).     FOLLOW UP: Our staff will call the number listed on your records the next business day following your procedure to check on you and address any questions or concerns that you may have regarding the information given to you following your procedure. If we do not reach you, we will leave a message.  However, if you are feeling well and you are not experiencing any problems, there is no need to return our call.  We will assume that you have returned to your regular daily activities without incident.  If any biopsies were taken you will be contacted by phone or by letter within the next 1-3 weeks.  Please call us at [phone] if you have not heard about the biopsies in 3 weeks.   Await for biopsy results Polyps (handout given) Diverticulosis (handout given) Hemorrhoids (handout given)   SIGNATURES/CONFIDENTIALITY: You and/or your care partner have signed paperwork which will be entered into your electronic medical record.  These signatures attest to the fact that that the information above on your After Visit Summary has been reviewed and is understood.  Full responsibility of the confidentiality of this discharge information lies with you and/or your care-partner. 

## 2018-11-20 NOTE — Progress Notes (Signed)
Called to room to assist during endoscopic procedure.  Patient ID and intended procedure confirmed with present staff. Received instructions for my participation in the procedure from the performing physician.  

## 2018-11-20 NOTE — Op Note (Signed)
[facility] Patient Name: Jacqueline Mcgee Procedure Date: 11/20/2018 10:45 AM MRN: [id number] Endoscopist: Remo Lipps P. Havery Moros , MD Age: 52 Referring MD:  Date of Birth: [birth year] Gender: Female Account #: [id number] Procedure:                Colonoscopy Indications:              Screening for colorectal malignant neoplasm, This                            is the patient's first colonoscopy Medicines:                Monitored Anesthesia Care Procedure:                Pre-Anesthesia Assessment:                           - Prior to the procedure, a History and Physical                            was performed, and patient medications and                            allergies were reviewed. The patient's tolerance of                            previous anesthesia was also reviewed. The risks                            and benefits of the procedure and the sedation                            options and risks were discussed with the patient.                            All questions were answered, and informed consent                            was obtained. Prior Anticoagulants: The patient has                            taken no previous anticoagulant or antiplatelet                            agents. ASA Grade Assessment: I - A normal, healthy                            patient. After reviewing the risks and benefits,                            the patient was deemed in satisfactory condition to                            undergo the procedure.  After obtaining informed consent, the colonoscope                            was passed under direct vision. Throughout the                            procedure, the patient's blood pressure, pulse, and                            oxygen saturations were monitored continuously. The                            Colonoscope was introduced through the anus and                            advanced to the the  cecum, identified by                            appendiceal orifice and ileocecal valve. The                            colonoscopy was performed without difficulty. The                            patient tolerated the procedure well. The quality                            of the bowel preparation was good. The ileocecal                            valve, appendiceal orifice, and rectum were                            photographed. Scope In: 10:52:05 AM Scope Out: 11:09:56 AM Scope Withdrawal Time: 0 hours 16 minutes 3 seconds  Total Procedure Duration: 0 hours 17 minutes 51 seconds  Findings:                 The perianal and digital rectal examinations were                            normal.                           A 3 mm polyp was found in the descending colon. The                            polyp was sessile. The polyp was removed with a                            cold snare. Resection and retrieval were complete.                           A 3 mm polyp was found in the recto-sigmoid colon.  The polyp was sessile. The polyp was removed with a                            cold snare. Resection and retrieval were complete.                           A few small-mouthed diverticula were found in the                            transverse colon and left colon.                           Internal hemorrhoids were found during                            retroflexion. The hemorrhoids were small.                           The exam was otherwise without abnormality. Complications:            No immediate complications. Estimated blood loss:                            Minimal. Estimated Blood Loss:     Estimated blood loss was minimal. Impression:               - One 3 mm polyp in the descending colon, removed                            with a cold snare. Resected and retrieved.                           - One 3 mm polyp at the recto-sigmoid colon,                             removed with a cold snare. Resected and retrieved.                           - Diverticulosis in the transverse colon and in the                            left colon.                           - Internal hemorrhoids.                           - The examination was otherwise normal. Recommendation:           - Patient has a contact number available for                            emergencies. The signs and symptoms of potential                            delayed complications  were discussed with the                            patient. Return to normal activities tomorrow.                            Written discharge instructions were provided to the                            patient.                           - Resume previous diet.                           - Continue present medications.                           - Await pathology results. Remo Lipps P. Javis Abboud, MD 11/20/2018 11:13:34 AM This report has been signed electronically.

## 2018-11-20 NOTE — Progress Notes (Signed)
Report given to PACU, vss 

## 2018-11-21 ENCOUNTER — Telehealth: Payer: Self-pay | Admitting: *Deleted

## 2018-11-21 NOTE — Telephone Encounter (Signed)
No answer for post procedure call back. Left message and will attempt to call back later this afternoon. SM 

## 2018-11-21 NOTE — Telephone Encounter (Signed)
No answer for post procedure call back. Left message for patient to call with questions or concerns. SM 

## 2018-11-27 ENCOUNTER — Encounter: Payer: Self-pay | Admitting: Gastroenterology

## 2019-08-03 ENCOUNTER — Ambulatory Visit (INDEPENDENT_AMBULATORY_CARE_PROVIDER_SITE_OTHER): Payer: BC Managed Care – PPO | Admitting: Family Medicine

## 2019-08-03 ENCOUNTER — Ambulatory Visit (INDEPENDENT_AMBULATORY_CARE_PROVIDER_SITE_OTHER): Payer: BC Managed Care – PPO

## 2019-08-03 ENCOUNTER — Encounter: Payer: Self-pay | Admitting: Family Medicine

## 2019-08-03 ENCOUNTER — Ambulatory Visit: Payer: BLUE CROSS/BLUE SHIELD | Admitting: Family Medicine

## 2019-08-03 ENCOUNTER — Other Ambulatory Visit: Payer: Self-pay

## 2019-08-03 VITALS — BP 118/78 | HR 84 | Temp 98.1°F | Resp 16 | Ht 63.5 in | Wt 176.6 lb

## 2019-08-03 DIAGNOSIS — M25562 Pain in left knee: Secondary | ICD-10-CM | POA: Diagnosis not present

## 2019-08-03 DIAGNOSIS — G8929 Other chronic pain: Secondary | ICD-10-CM

## 2019-08-03 DIAGNOSIS — M25862 Other specified joint disorders, left knee: Secondary | ICD-10-CM | POA: Diagnosis not present

## 2019-08-03 MED ORDER — DICLOFENAC SODIUM 75 MG PO TBEC: 75.0000 mg | DELAYED_RELEASE_TABLET | Freq: Two times a day (BID) | ORAL | 0 refills | Status: AC

## 2019-08-03 NOTE — Progress Notes (Signed)
Subjective  CC:  Chief Complaint  Patient presents with   Knee Pain    Left knee, started a few months ago and getting worse.. Reports that the pain comes/goes.. She has tried Aleve.. Denies injury    HPI: Jacqueline Mcgee is a 53 y.o. female who presents to the office today to address the problems listed above in the chief complaint.  As above, left anterior/lateral knee pain x several months. Mainly a soreness and tenderness when touches or kneels on knee. But at times has ache. Denies locking or giveway, swelling, warmth or redness or injury. 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 Walks for exercise; no jogging or running. No h/o knee problems.  Right knee is fine. No limp. Rare nsaid helps pain.   Assessment  1. Chronic pain of left knee      Plan   Knee pain:  ? Collateral ligament strain or bursitis or other. RICE, voltaren and knee sleeve. Await final read of xray. No acute findings noted by me. Recheck in 2 weeks if not improving. ? SM referral.  Follow up: prn  Visit date not found  Orders Placed This Encounter  Procedures   DG Knee AP/LAT W/Sunrise Left   Meds ordered this encounter  Medications   diclofenac (VOLTAREN) 75 MG EC tablet    Sig: Take 1 tablet (75 mg total) by mouth 2 (two) times daily.    Dispense:  30 tablet    Refill:  0      I reviewed the patients updated PMH, FH, and SocHx.    Patient Active Problem List   Diagnosis Date Noted   Mixed hyperlipidemia 08/29/2018   Family history of premature CAD 08/29/2018   No outpatient medications have been marked as taking for the 08/03/19 encounter (Office Visit) with Leamon Arnt, MD.    Allergies: Patient is allergic to sulfa antibiotics. Family History: Patient family history includes Arthritis in her mother; Colon cancer in her maternal grandmother; Depression in her brother; Healthy in her daughter, son, and son; Heart disease in her paternal grandfather;  Melanoma in her daughter. Social History:  Patient  reports that she has never smoked. She has never used smokeless tobacco. She reports current alcohol use. She reports previous drug use.  Review of Systems: Constitutional: Negative for fever malaise or anorexia Cardiovascular: negative for chest pain Respiratory: negative for SOB or persistent cough Gastrointestinal: negative for abdominal pain  Objective  Vitals: BP 118/78    Pulse 84    Temp 98.1 F (36.7 C) (Tympanic)    Resp 16    Ht 5' 3.5" (1.613 m)    Wt 176 lb 9.6 oz (80.1 kg)    SpO2 97%    BMI 30.79 kg/m  General: no acute distress , A&Ox3 Right knee: normal Left knee: normal appearing, no redness or warmth. No crepitus. ttp over lateral tibial plateau and pain with valgus stressing. Neg murrays neg lachman. Nl gait. Pain free with deep squat. No medial ttp. No tibial tubercle ttp  Skin:  Warm, no rashes     Commons side effects, risks, benefits, and alternatives for medications and treatment plan prescribed today were discussed, and the patient expressed understanding of the given instructions. Patient is instructed to call or message via MyChart if he/she has any questions or concerns regarding our treatment plan. No barriers to understanding were identified. We discussed Red Flag symptoms and signs in detail. Patient expressed understanding regarding what to do  in case of urgent or emergency type symptoms.   Medication list was reconciled, printed and provided to the patient in AVS. Patient instructions and summary information was reviewed with the patient as documented in the AVS. This note was prepared with assistance of Dragon voice recognition software. Occasional wrong-word or sound-a-like substitutions may have occurred due to the inherent limitations of voice recognition software

## 2019-08-03 NOTE — Progress Notes (Signed)
Please call patient: I have reviewed his/her xray results. Xray shows mild arthritic changes as discussed today. No other findings. Follow instructions give today in office.

## 2019-08-03 NOTE — Patient Instructions (Signed)
Please return in November for your annual complete physical; please come fasting.  Please start icing the tender area twice a day for 3-7 days.  Take 7-10 days of the twice a day diclofenac to calm down any inflammation.  Purchase a knee sleeve and wear it mostly during the day.    If you have any questions or concerns, please don't hesitate to send me a message via MyChart or call the office at [phone]. Thank you for visiting with Jacqueline Mcgee today! It's our pleasure caring for you.  Acute Knee Pain, Adult Acute knee pain is sudden and may be caused by damage, swelling, or irritation of the muscles and tissues that support your knee. The injury may result from:  A fall.  An injury to your knee from twisting motions.  A hit to the knee.  Infection. Acute knee pain may go away on its own with time and rest. If it does not, your health care provider may order tests to find the cause of the pain. These may include:  Imaging tests, such as an X-ray, MRI, or ultrasound.  Joint aspiration. In this test, fluid is removed from the knee.  Arthroscopy. In this test, a lighted tube is inserted into the knee and an image is projected onto a TV screen.  Biopsy. In this test, a sample of tissue is removed from the body and studied under a microscope. Follow these instructions at home: Pay attention to any changes in your symptoms. Take these actions to relieve your pain. If you have a knee sleeve or brace:   Wear the sleeve or brace as told by your health care provider. Remove it only as told by your health care provider.  Loosen the sleeve or brace if your toes tingle, become numb, or turn cold and blue.  Keep the sleeve or brace clean.  If the sleeve or brace is not waterproof: ? Do not let it get wet. ? Cover it with a watertight covering when you take a bath or shower. Activity  Rest your knee.  Do not do things that cause pain or make pain worse.  Avoid high-impact activities or  exercises, such as running, jumping rope, or doing jumping jacks.  Work with a physical therapist to make a safe exercise program, as recommended by your health care provider. Do exercises as told by your physical therapist. Managing pain, stiffness, and swelling   If directed, put ice on the knee: ? Put ice in a plastic bag. ? Place a towel between your skin and the bag. ? Leave the ice on for 20 minutes, 2-3 times a day.  If directed, use an elastic bandage to put pressure (compression) on your injured knee. This may control swelling, give support, and help with discomfort. General instructions  Take over-the-counter and prescription medicines only as told by your health care provider.  Raise (elevate) your knee above the level of your heart when you are sitting or lying down.  Sleep with a pillow under your knee.  Do not use any products that contain nicotine or tobacco, such as cigarettes, e-cigarettes, and chewing tobacco. These can delay healing. If you need help quitting, ask your health care provider.  If you are overweight, work with your health care provider and a dietitian to set a weight-loss goal that is healthy and reasonable for you. Extra weight can put pressure on your knee.  Keep all follow-up visits as told by your health care provider. This is important. Contact a  health care provider if:  Your knee pain continues, changes, or gets worse.  You have a fever along with knee pain.  Your knee feels warm to the touch.  Your knee buckles or locks up. Get help right away if:  Your knee swells, and the swelling becomes worse.  You cannot move your knee.  You have severe pain in your knee. Summary  Acute knee pain can be caused by a fall, an injury, an infection, or damage, swelling, or irritation of the tissues that support your knee.  Your health care provider may perform tests to find out the cause of the pain.  Pay attention to any changes in your  symptoms. Relieve your pain with rest, medicines, light activity, and use of ice.  Get help if your pain continues or becomes worse, your knee swells, or you cannot move your knee. This information is not intended to replace advice given to you by your health care provider. Make sure you discuss any questions you have with your health care provider. Document Released: 09/05/2007 Document Revised: 04/20/2018 Document Reviewed: 04/20/2018 Elsevier Patient Education  2020 [employer].

## 2019-11-14 ENCOUNTER — Other Ambulatory Visit: Payer: BC Managed Care – PPO

## 2020-01-13 DIAGNOSIS — H109 Unspecified conjunctivitis: Secondary | ICD-10-CM | POA: Diagnosis not present

## 2020-02-26 ENCOUNTER — Telehealth (INDEPENDENT_AMBULATORY_CARE_PROVIDER_SITE_OTHER): Payer: BC Managed Care – PPO | Admitting: Family Medicine

## 2020-02-26 ENCOUNTER — Encounter: Payer: Self-pay | Admitting: Family Medicine

## 2020-02-26 ENCOUNTER — Other Ambulatory Visit: Payer: Self-pay

## 2020-02-26 ENCOUNTER — Other Ambulatory Visit: Payer: Self-pay | Admitting: Family Medicine

## 2020-02-26 DIAGNOSIS — H1033 Unspecified acute conjunctivitis, bilateral: Secondary | ICD-10-CM | POA: Diagnosis not present

## 2020-02-26 DIAGNOSIS — Z1231 Encounter for screening mammogram for malignant neoplasm of breast: Secondary | ICD-10-CM

## 2020-02-26 MED ORDER — ERYTHROMYCIN 5 MG/GM OP OINT: 1.0000 "application " | TOPICAL_OINTMENT | Freq: Three times a day (TID) | OPHTHALMIC | 0 refills | Status: AC

## 2020-02-26 NOTE — Progress Notes (Signed)
     Virtual Visit via Video Note  Subjective  CC:  Chief Complaint  Patient presents with  . Conjunctivitis    both eyes, started this morning, slightly swollen, extremely red, denies fevers, has had conjunctivitis in the past several months     I connected with Gerre Scull on 02/26/20 at 11:30 AM EDT by a video enabled telemedicine application and verified that I am speaking with the correct person using two identifiers. Location patient: Home Location provider: [facility], Office Persons participating in the virtual visit: Coralyn Helling, MD Serita Sheller, [place] discussed the limitations of evaluation and management by telemedicine and the availability of in person appointments. The patient expressed understanding and agreed to proceed. HPI: Jacqueline Mcgee is a 54 y.o. female who was contacted today to address the problems listed above in the chief complaint. . Awoke 2am with red eyes and crusting eye lashes; has had conjunctivitis about 3 months ago treated with trobramycin. No cold sxs. No eye pain or vision changes. Feels otherwise well. No itching.  Marland Kitchen HM: overdue for CPE Assessment  1. Acute bacterial conjunctivitis of both eyes   2. Encounter for screening mammogram for breast cancer      Plan   Pink eye:  Recurrent. Change to emycin ointment and f/u if not improving. Consider allergic conjunctivitis as well but more likely viral or bacterial given thick crusting, quick onset and severity.   rec mammogram; pt to schedule.  rec cpe with lab.  Needs f/u on HLD> I discussed the assessment and treatment plan with the patient. The patient was provided an opportunity to ask questions and all were answered. The patient agreed with the plan and demonstrated an understanding of the instructions.   The patient was advised to call back or seek an in-person evaluation if the symptoms worsen or if the condition  fails to improve as anticipated. Follow up: Return for complete physical.  Visit date not found  Meds ordered this encounter  Medications  . erythromycin ophthalmic ointment    Sig: Place 1 application into the left eye 3 (three) times daily.    Dispense:  3.5 g    Refill:  0      I reviewed the patients updated PMH, FH, and SocHx.    Patient Active Problem List   Diagnosis Date Noted  . Mixed hyperlipidemia 08/29/2018  . Family history of premature CAD 08/29/2018   No outpatient medications have been marked as taking for the 02/26/20 encounter (Video Visit) with Leamon Arnt, MD.    Allergies: Patient is allergic to sulfa antibiotics. Family History: Patient family history includes Arthritis in her mother; Colon cancer in her maternal grandmother; Depression in her brother; Healthy in her daughter, son, and son; Heart disease in her paternal grandfather; Melanoma in her daughter. Social History:  Patient  reports that she has never smoked. She has never used smokeless tobacco. She reports current alcohol use. She reports previous drug use.  Review of Systems: Constitutional: Negative for fever malaise or anorexia Cardiovascular: negative for chest pain Respiratory: negative for SOB or persistent cough Gastrointestinal: negative for abdominal pain  OBJECTIVE Vitals: There were no vitals taken for this visit. General: no acute distress , A&Ox3, appears well Conjunctiva bilateral injection Leamon Arnt, MD

## 2020-02-26 NOTE — Patient Instructions (Signed)
Please return for your annual complete physical; please come fasting.  I have ordered a mammogram and/or bone density for you as we discussed today: [x]   Mammogram  []   Bone Density  Please call the office checked below to schedule your appointment: Your appointment will at the following location  [x]   [facility]      [place], [place]         []   [facility]  [place], [place]  Make sure to wear two peace clothing  No lotions powders or deodorants the day of the appointment Make sure to bring picture ID and insurance card.  Bring list of medications you are currently taking including any supplements.   If you have any questions or concerns, please don't hesitate to send me a message via MyChart or call the office at [phone]. Thank you for visiting with [place] today! It's our pleasure caring for you.

## 2020-03-04 ENCOUNTER — Telehealth: Payer: Self-pay | Admitting: Family Medicine

## 2020-03-04 ENCOUNTER — Other Ambulatory Visit: Payer: Self-pay

## 2020-03-04 NOTE — Telephone Encounter (Signed)
LMOVM advising patient that PCP is gone for the day. Offered appt with PCP tomorrow or with another provider this afternoon. Advised to call back.

## 2020-03-04 NOTE — Telephone Encounter (Signed)
Patient states she has seen Dr. Jonni Sanger for pink eye and was prescribed a medication.  Patient states that she noticed some improvement but now it has gotten worse.  States that she is still taking medication.  Would like to know what else she can do?

## 2020-03-05 ENCOUNTER — Ambulatory Visit (INDEPENDENT_AMBULATORY_CARE_PROVIDER_SITE_OTHER): Payer: BC Managed Care – PPO | Admitting: Family Medicine

## 2020-03-05 ENCOUNTER — Ambulatory Visit
Admission: RE | Admit: 2020-03-05 | Discharge: 2020-03-05 | Disposition: A | Discharge status: Home / Self Care | Payer: BC Managed Care – PPO | Source: Ambulatory Visit | Attending: Family Medicine | Admitting: Family Medicine

## 2020-03-05 ENCOUNTER — Encounter: Payer: Self-pay | Admitting: Family Medicine

## 2020-03-05 VITALS — BP 128/60 | HR 81 | Temp 98.2°F | Resp 18 | Ht 63.5 in | Wt 180.6 lb

## 2020-03-05 DIAGNOSIS — H109 Unspecified conjunctivitis: Secondary | ICD-10-CM

## 2020-03-05 DIAGNOSIS — Z1231 Encounter for screening mammogram for malignant neoplasm of breast: Secondary | ICD-10-CM | POA: Diagnosis not present

## 2020-03-05 MED ORDER — BESIFLOXACIN HCL 0.6 % OP SUSP: 1.0000 [drp] | Freq: Three times a day (TID) | OPHTHALMIC | 0 refills | Status: AC

## 2020-03-05 NOTE — Patient Instructions (Signed)
Please return for your annual complete physical; please come fasting.   Try OTC Zadator eye drops as well for allergic component if present.  I've ordered besivance antibacterial drops for you. Schedule with an eye doctor for a closer look if doesn't improve or recurs.   If you have any questions or concerns, please don't hesitate to send me a message via MyChart or call the office at [phone]. Thank you for visiting with [place] today! It's our pleasure caring for you.

## 2020-03-05 NOTE — Progress Notes (Signed)
Subjective  CC:  Chief Complaint  Patient presents with  . Conjunctivitis    Patient mentioned that it started last tuesday. She used the medication prescribed, she stated that it was getting better but this past Saturday is when it got worse. She has some puffness and redness to both eyes.     HPI: Jacqueline Mcgee is a 54 y.o. female who presents to the office today to address the problems listed above in the chief complaint.  See last note. Evaluated via virtual video 4/6 for what sounded like acute bacterial conjunctivitis treated with emycin ointment. As above, sxs improved but now back again. Red, itching, uncomfortable, scratchy and yellowish drainage with crusting. No h/o significant allergic sxs. This is second bout in last 3 months, first resolved with tobramycin. No uri sxs. No photophobia.    Assessment  1. Conjunctivitis of both eyes, unspecified conjunctivitis type      Plan   conjunctivits:  Atypical: could be viral, bacterial, allergic or other (keratitis, etc). Trial of zadator antihistamine drops and change to besivance. If not resolved in one week, needs full eye exam. No red flag findings on exam today.   Follow up:   For cpe.   No orders of the defined types were placed in this encounter.  Meds ordered this encounter  Medications  . Besifloxacin HCl 0.6 % SUSP    Sig: Apply 1 drop to eye in the morning, at noon, and at bedtime for 7 days.    Dispense:  5 mL    Refill:  0      I reviewed the patients updated PMH, FH, and SocHx.    Patient Active Problem List   Diagnosis Date Noted  . Mixed hyperlipidemia 08/29/2018  . Family history of premature CAD 08/29/2018   Current Meds  Medication Sig  . erythromycin ophthalmic ointment Place 1 application into the left eye 3 (three) times daily.  . S-Adenosylmethionine (SAM-E PO) Take by mouth.    Allergies: Patient is allergic to sulfa antibiotics. Family History: Patient family history includes  Arthritis in her mother; Colon cancer in her maternal grandmother; Depression in her brother; Healthy in her daughter, son, and son; Heart disease in her paternal grandfather; Melanoma in her daughter. Social History:  Patient  reports that she has never smoked. She has never used smokeless tobacco. She reports current alcohol use. She reports previous drug use.  Review of Systems: Constitutional: Negative for fever malaise or anorexia Cardiovascular: negative for chest pain Respiratory: negative for SOB or persistent cough Gastrointestinal: negative for abdominal pain  Objective  Vitals: BP 128/60   Pulse 81   Temp 98.2 F (36.8 C) (Temporal)   Resp 18   Ht 5' 3.5" (1.613 m)   Wt 180 lb 9.6 oz (81.9 kg)   SpO2 98%   BMI 31.49 kg/m  General: no acute distress , A&Ox3 HEENT: PEERL, conjunctiva mildly injected bilaterally. No limbus involvement. No photophobia. Mildly red lids w/o warmth. No chemosis.      Commons side effects, risks, benefits, and alternatives for medications and treatment plan prescribed today were discussed, and the patient expressed understanding of the given instructions. Patient is instructed to call or message via MyChart if he/she has any questions or concerns regarding our treatment plan. No barriers to understanding were identified. We discussed Red Flag symptoms and signs in detail. Patient expressed understanding regarding what to do in case of urgent or emergency type symptoms.   Medication list was  reconciled, printed and provided to the patient in AVS. Patient instructions and summary information was reviewed with the patient as documented in the AVS. This note was prepared with assistance of Dragon voice recognition software. Occasional wrong-word or sound-a-like substitutions may have occurred due to the inherent limitations of voice recognition software  This visit occurred during the SARS-CoV-2 public health emergency.  Safety protocols were in place,  including screening questions prior to the visit, additional usage of staff PPE, and extensive cleaning of exam room while observing appropriate contact time as indicated for disinfecting solutions.

## 2020-03-07 ENCOUNTER — Ambulatory Visit (INDEPENDENT_AMBULATORY_CARE_PROVIDER_SITE_OTHER): Payer: BC Managed Care – PPO | Admitting: Family Medicine

## 2020-03-07 ENCOUNTER — Other Ambulatory Visit: Payer: Self-pay

## 2020-03-07 ENCOUNTER — Encounter: Payer: Self-pay | Admitting: Family Medicine

## 2020-03-07 VITALS — BP 116/76 | HR 68 | Temp 97.8°F | Resp 15 | Ht 64.0 in | Wt 180.0 lb

## 2020-03-07 DIAGNOSIS — L659 Nonscarring hair loss, unspecified: Secondary | ICD-10-CM | POA: Diagnosis not present

## 2020-03-07 DIAGNOSIS — H109 Unspecified conjunctivitis: Secondary | ICD-10-CM

## 2020-03-07 LAB — CBC WITH DIFFERENTIAL/PLATELET
Basophils Absolute: 0.1 10*3/uL (ref 0.0–0.1)
Basophils Relative: 1.2 % (ref 0.0–3.0)
Eosinophils Absolute: 0.2 10*3/uL (ref 0.0–0.7)
Eosinophils Relative: 2.4 % (ref 0.0–5.0)
HCT: 42.4 % (ref 36.0–46.0)
Hemoglobin: 14.3 g/dL (ref 12.0–15.0)
Lymphocytes Relative: 30.5 % (ref 12.0–46.0)
Lymphs Abs: 2.2 10*3/uL (ref 0.7–4.0)
MCHC: 33.8 g/dL (ref 30.0–36.0)
MCV: 91.3 fl (ref 78.0–100.0)
Monocytes Absolute: 0.7 10*3/uL (ref 0.1–1.0)
Monocytes Relative: 9.7 % (ref 3.0–12.0)
Neutro Abs: 4.1 10*3/uL (ref 1.4–7.7)
Neutrophils Relative %: 56.2 % (ref 43.0–77.0)
Platelets: 262 10*3/uL (ref 150.0–400.0)
RBC: 4.64 Mil/uL (ref 3.87–5.11)
RDW: 12.9 % (ref 11.5–15.5)
WBC: 7.3 10*3/uL (ref 4.0–10.5)

## 2020-03-07 LAB — B12 AND FOLATE PANEL
Folate: 11.8 ng/mL (ref >5.9)
Vitamin B-12: 169 pg/mL — ABNORMAL LOW (ref 211–911)

## 2020-03-07 LAB — TSH: TSH: 1.55 u[IU]/mL (ref 0.35–4.50)

## 2020-03-07 LAB — VITAMIN D 25 HYDROXY (VIT D DEFICIENCY, FRACTURES): VITD: 24.95 ng/mL — ABNORMAL LOW (ref 30.00–100.00)

## 2020-03-07 NOTE — Patient Instructions (Signed)
Please follow up as scheduled for your next visit with me: 05/14/2020 for physical.   Your hair thinning could be due to many things, the two most likely possibilities are called: Telogen Effluvium or female patterned hair loss. We will get you to the dermatologist if needed. Often, time helps.   I will release your lab results to you on your MyChart account with further instructions. Please reply with any questions.   If you have any questions or concerns, please don't hesitate to send me a message via MyChart or call the office at [phone]. Thank you for visiting with [place] today! It's our pleasure caring for you.

## 2020-03-07 NOTE — Progress Notes (Signed)
Subjective  CC:  Chief Complaint  Patient presents with  . Alopecia    started 3-4 weeks ago, denies any new shampoos/medications, no past history of hair loss    HPI: Jacqueline Mcgee is a 54 y.o. female who presents to the office today to address the problems listed above in the chief complaint.  3-4 weeks of hair coming out in clumps, seems diffusely. No rashes or scalp irritation. No h/o same. 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 No illnesses. Feels well otherwise  Conjunctivitis: was here 2 days ago: ? Viral vs bacterial vs allergic rhinitis: doing much better w/o new drops.    Assessment  1. Hair thinning   2. Conjunctivitis of both eyes, unspecified conjunctivitis type      Plan   Hair thinning:  Exam shows frontal thinning mostly; likely possibilities include mild female pattern balding w/ TE or TE or other. Check labs, education given, give time, consider rogaine and to derm if worsening. Patient understands and agrees with care plan.   Conjunctivitis: improved. Suspect viral and/or allergic. F/u if recurs.   Follow up: Return for as scheduled.  05/14/2020  Orders Placed This Encounter  Procedures  . TSH  . VITAMIN D 25 Hydroxy (Vit-D Deficiency, Fractures)  . B12 and Folate Panel  . CBC with Differential/Platelet  . Iron, TIBC and Ferritin Panel  . DHEA-sulfate  . Testosterone Total,Free,Bio, Males   No orders of the defined types were placed in this encounter.     I reviewed the patients updated PMH, FH, and SocHx.    Patient Active Problem List   Diagnosis Date Noted  . Mixed hyperlipidemia 08/29/2018  . Family history of premature CAD 08/29/2018   Current Meds  Medication Sig  . Besifloxacin HCl 0.6 % SUSP Apply 1 drop to eye in the morning, at noon, and at bedtime for 7 days.  . diclofenac (VOLTAREN) 75 MG EC tablet Take 1 tablet (75 mg total) by mouth 2 (two) times daily.  Marland Kitchen erythromycin ophthalmic  ointment Place 1 application into the left eye 3 (three) times daily.  . S-Adenosylmethionine (SAM-E PO) Take by mouth.    Allergies: Patient is allergic to sulfa antibiotics. Family History: Patient family history includes Arthritis in her mother; Colon cancer in her maternal grandmother; Depression in her brother; Healthy in her daughter, son, and son; Heart disease in her paternal grandfather; Melanoma in her daughter. Social History:  Patient  reports that she has never smoked. She has never used smokeless tobacco. She reports current alcohol use. She reports previous drug use.  Review of Systems: Constitutional: Negative for fever malaise or anorexia Cardiovascular: negative for chest pain Respiratory: negative for SOB or persistent cough Gastrointestinal: negative for abdominal pain  Objective  Vitals: BP 116/76   Pulse 68   Temp 97.8 F (36.6 C) (Temporal)   Resp 15   Ht 5\' 4"  (1.626 m)   Wt 180 lb (81.6 kg)   SpO2 99%   BMI 30.90 kg/m  General: no acute distress , A&Ox3 Scalp: no rashes, redness,flaking or irritation. Frontal thinning present. Neg pull testing.      Commons side effects, risks, benefits, and alternatives for medications and treatment plan prescribed today were discussed, and the patient expressed understanding of the given instructions. Patient is instructed to call or message via MyChart if he/she has any questions or concerns regarding our treatment plan. No barriers to understanding were identified.  We discussed Red Flag symptoms and signs in detail. Patient expressed understanding regarding what to do in case of urgent or emergency type symptoms.   Medication list was reconciled, printed and provided to the patient in AVS. Patient instructions and summary information was reviewed with the patient as documented in the AVS. This note was prepared with assistance of Dragon voice recognition software. Occasional wrong-word or sound-a-like substitutions  may have occurred due to the inherent limitations of voice recognition software  This visit occurred during the SARS-CoV-2 public health emergency.  Safety protocols were in place, including screening questions prior to the visit, additional usage of staff PPE, and extensive cleaning of exam room while observing appropriate contact time as indicated for disinfecting solutions.

## 2020-03-12 LAB — CP TESTOSTERONE, BIO-FEMALE/CHILDREN
Albumin: 4.7 g/dL (ref 3.6–5.1)
Sex Hormone Binding: 34 nmol/L (ref 17–124)
TESTOSTERONE, BIOAVAILABLE: 3.9 ng/dL (ref 0.5–8.5)
Testosterone, Free: 1.8 pg/mL (ref 0.2–5.0)
Testosterone, Total, LC-MS-MS: 16 ng/dL (ref 2–45)

## 2020-03-12 LAB — DHEA-SULFATE: DHEA-SO4: 140 ug/dL (ref 8–188)

## 2020-03-12 LAB — IRON,TIBC AND FERRITIN PANEL
%SAT: 18 % (calc) (ref 16–45)
Ferritin: 56 ng/mL (ref 16–232)
Iron: 64 ug/dL (ref 45–160)
TIBC: 346 mcg/dL (calc) (ref 250–450)

## 2020-05-07 ENCOUNTER — Encounter: Payer: BC Managed Care – PPO | Admitting: Family Medicine

## 2020-05-14 ENCOUNTER — Encounter: Payer: BC Managed Care – PPO | Admitting: Family Medicine

## 2021-04-23 DIAGNOSIS — M79641 Pain in right hand: Secondary | ICD-10-CM | POA: Diagnosis not present

## 2021-04-23 DIAGNOSIS — M79642 Pain in left hand: Secondary | ICD-10-CM | POA: Diagnosis not present

## 2021-05-07 DIAGNOSIS — E559 Vitamin D deficiency, unspecified: Secondary | ICD-10-CM | POA: Diagnosis not present

## 2021-05-07 DIAGNOSIS — Z808 Family history of malignant neoplasm of other organs or systems: Secondary | ICD-10-CM | POA: Diagnosis not present

## 2021-05-07 DIAGNOSIS — Z0000 Encounter for general adult medical examination without abnormal findings: Secondary | ICD-10-CM | POA: Diagnosis not present

## 2021-05-07 DIAGNOSIS — Z13220 Encounter for screening for lipoid disorders: Secondary | ICD-10-CM | POA: Diagnosis not present

## 2021-07-07 DIAGNOSIS — L821 Other seborrheic keratosis: Secondary | ICD-10-CM | POA: Diagnosis not present

## 2021-07-07 DIAGNOSIS — D225 Melanocytic nevi of trunk: Secondary | ICD-10-CM | POA: Diagnosis not present

## 2021-07-07 DIAGNOSIS — D235 Other benign neoplasm of skin of trunk: Secondary | ICD-10-CM | POA: Diagnosis not present

## 2021-07-07 DIAGNOSIS — L814 Other melanin hyperpigmentation: Secondary | ICD-10-CM | POA: Diagnosis not present

## 2021-08-21 ENCOUNTER — Other Ambulatory Visit: Payer: Self-pay | Admitting: Physician Assistant

## 2021-08-21 DIAGNOSIS — Z1231 Encounter for screening mammogram for malignant neoplasm of breast: Secondary | ICD-10-CM

## 2021-09-07 DIAGNOSIS — F411 Generalized anxiety disorder: Secondary | ICD-10-CM | POA: Diagnosis not present

## 2021-09-07 DIAGNOSIS — B372 Candidiasis of skin and nail: Secondary | ICD-10-CM | POA: Diagnosis not present

## 2021-09-07 DIAGNOSIS — M1990 Unspecified osteoarthritis, unspecified site: Secondary | ICD-10-CM | POA: Diagnosis not present

## 2021-09-07 DIAGNOSIS — F5101 Primary insomnia: Secondary | ICD-10-CM | POA: Diagnosis not present

## 2021-09-18 ENCOUNTER — Ambulatory Visit
Admission: RE | Admit: 2021-09-18 | Discharge: 2021-09-18 | Disposition: A | Discharge status: Home / Self Care | Payer: BC Managed Care – PPO | Source: Ambulatory Visit | Attending: Physician Assistant | Admitting: Physician Assistant

## 2021-09-18 ENCOUNTER — Other Ambulatory Visit: Payer: Self-pay

## 2021-09-18 DIAGNOSIS — Z1231 Encounter for screening mammogram for malignant neoplasm of breast: Secondary | ICD-10-CM

## 2021-10-06 DIAGNOSIS — H10023 Other mucopurulent conjunctivitis, bilateral: Secondary | ICD-10-CM | POA: Diagnosis not present

## 2021-10-08 DIAGNOSIS — H1033 Unspecified acute conjunctivitis, bilateral: Secondary | ICD-10-CM | POA: Diagnosis not present

## 2021-11-05 DIAGNOSIS — M25541 Pain in joints of right hand: Secondary | ICD-10-CM | POA: Diagnosis not present

## 2021-11-05 DIAGNOSIS — R2231 Localized swelling, mass and lump, right upper limb: Secondary | ICD-10-CM | POA: Diagnosis not present

## 2021-11-05 DIAGNOSIS — M65849 Other synovitis and tenosynovitis, unspecified hand: Secondary | ICD-10-CM | POA: Diagnosis not present

## 2022-01-20 DIAGNOSIS — M25562 Pain in left knee: Secondary | ICD-10-CM | POA: Diagnosis not present

## 2022-01-26 DIAGNOSIS — M25562 Pain in left knee: Secondary | ICD-10-CM | POA: Diagnosis not present

## 2022-01-26 DIAGNOSIS — M222X2 Patellofemoral disorders, left knee: Secondary | ICD-10-CM | POA: Diagnosis not present

## 2022-02-05 DIAGNOSIS — M25562 Pain in left knee: Secondary | ICD-10-CM | POA: Diagnosis not present

## 2022-02-05 DIAGNOSIS — M222X2 Patellofemoral disorders, left knee: Secondary | ICD-10-CM | POA: Diagnosis not present

## 2022-02-17 DIAGNOSIS — M25562 Pain in left knee: Secondary | ICD-10-CM | POA: Diagnosis not present

## 2022-02-22 DIAGNOSIS — R2231 Localized swelling, mass and lump, right upper limb: Secondary | ICD-10-CM | POA: Diagnosis not present

## 2022-04-12 DIAGNOSIS — H1033 Unspecified acute conjunctivitis, bilateral: Secondary | ICD-10-CM | POA: Diagnosis not present

## 2022-05-01 IMAGING — MG MM DIGITAL SCREENING BILAT W/ TOMO AND CAD
8 series · 8 of 24 positions shown · non-contrast
Comparison: Previous exam(s).

CLINICAL DATA: Screening.

EXAM:
DIGITAL SCREENING BILATERAL MAMMOGRAM WITH TOMOSYNTHESIS AND CAD
TECHNIQUE: Bilateral screening digital craniocaudal and mediolateral oblique
mammograms were obtained. Bilateral screening digital breast
tomosynthesis was performed. The images were evaluated with
computer-aided detection.

[R CC synth-2D]
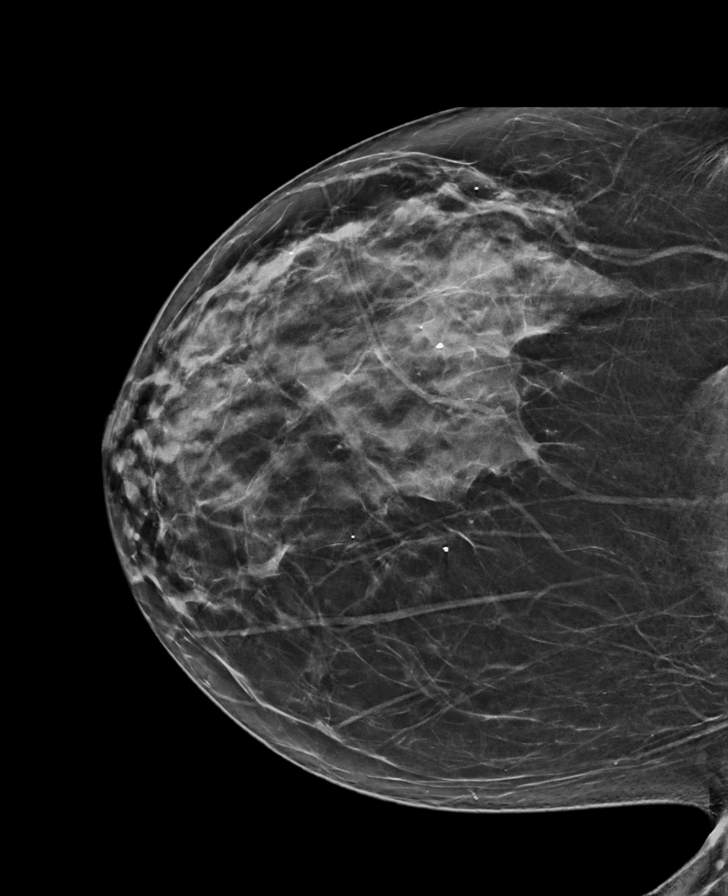

[L MLO synth-2D]
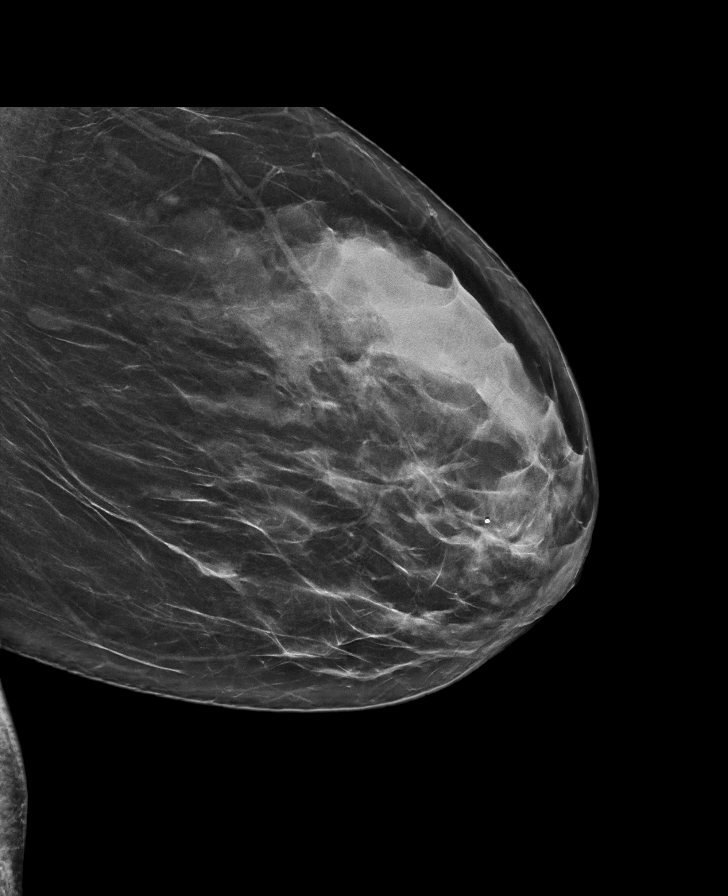

[L CC synth-2D]
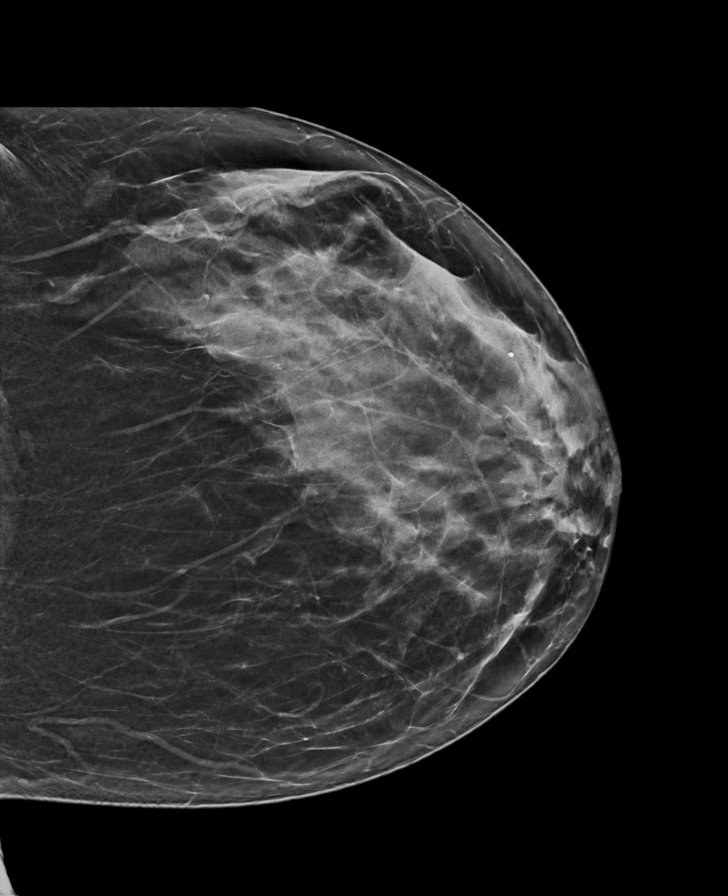

[R MLO synth-2D]
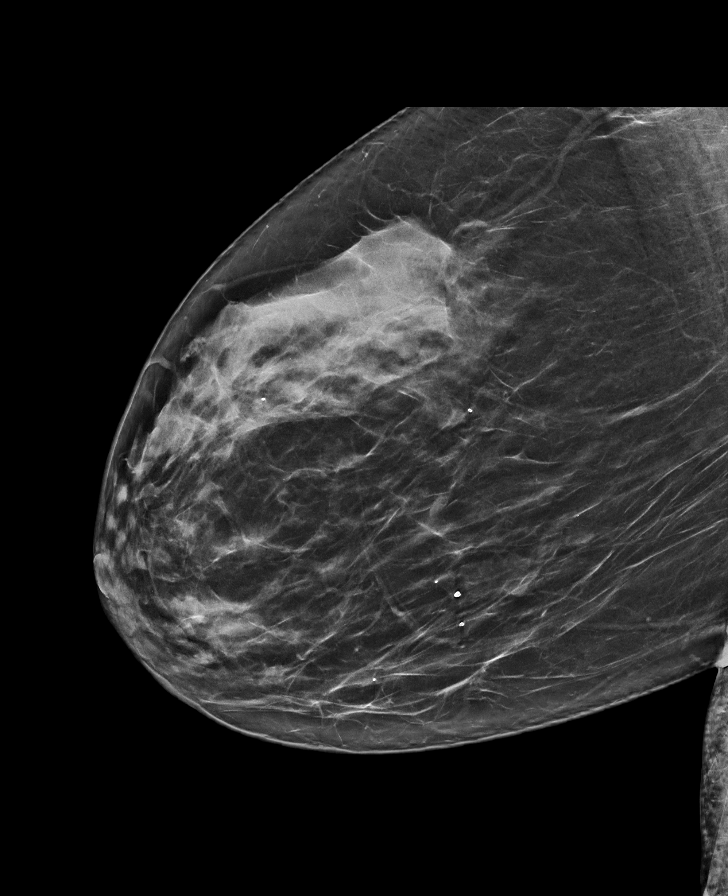

[R MLO tomo · tomo slice 39/77.0]
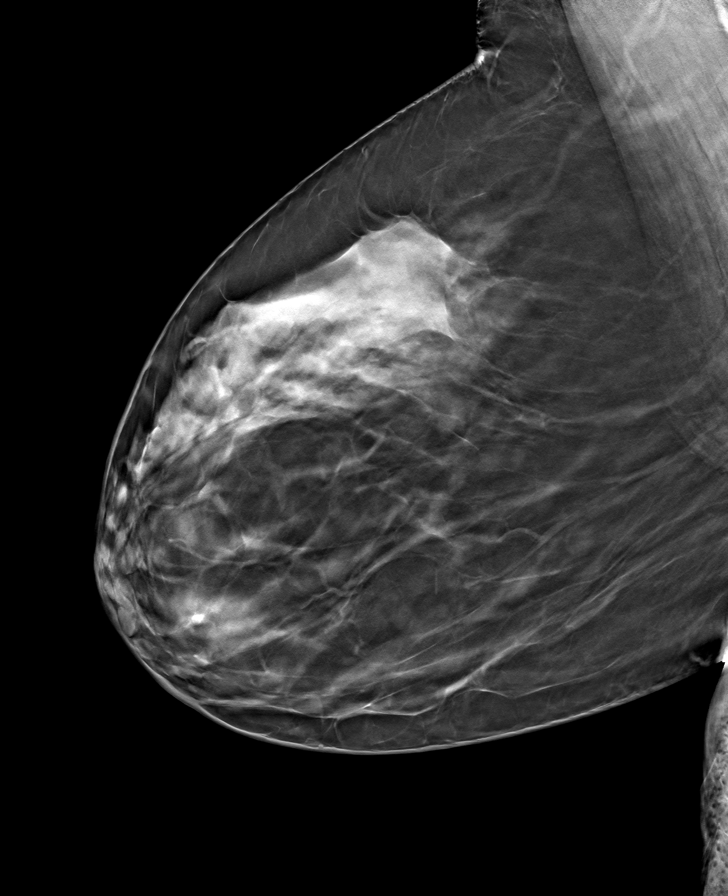

[L MLO tomo · tomo slice 39/77.0]
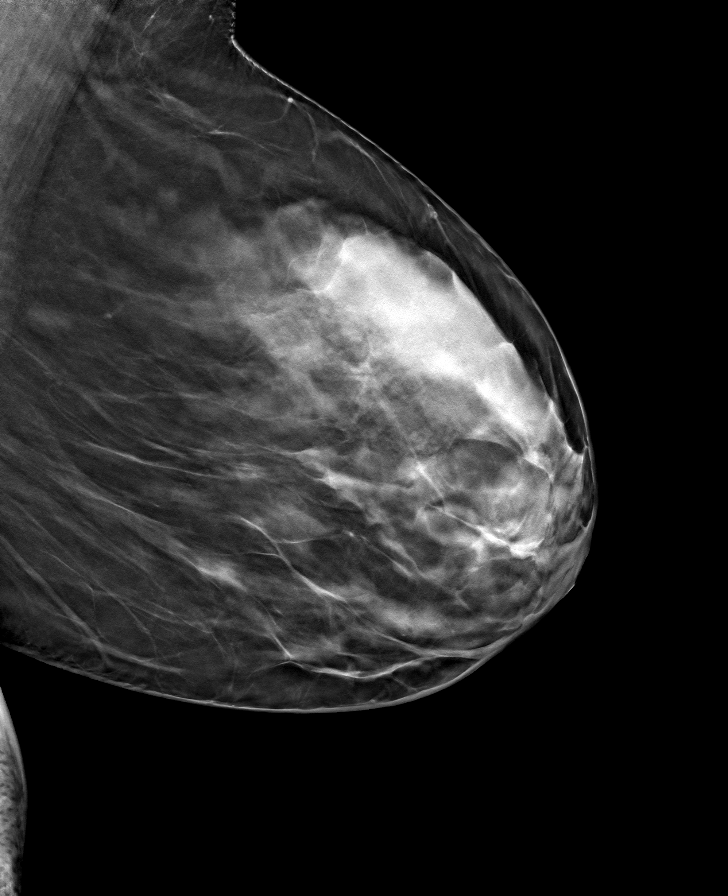

[L CC tomo · tomo slice 33/66.0]
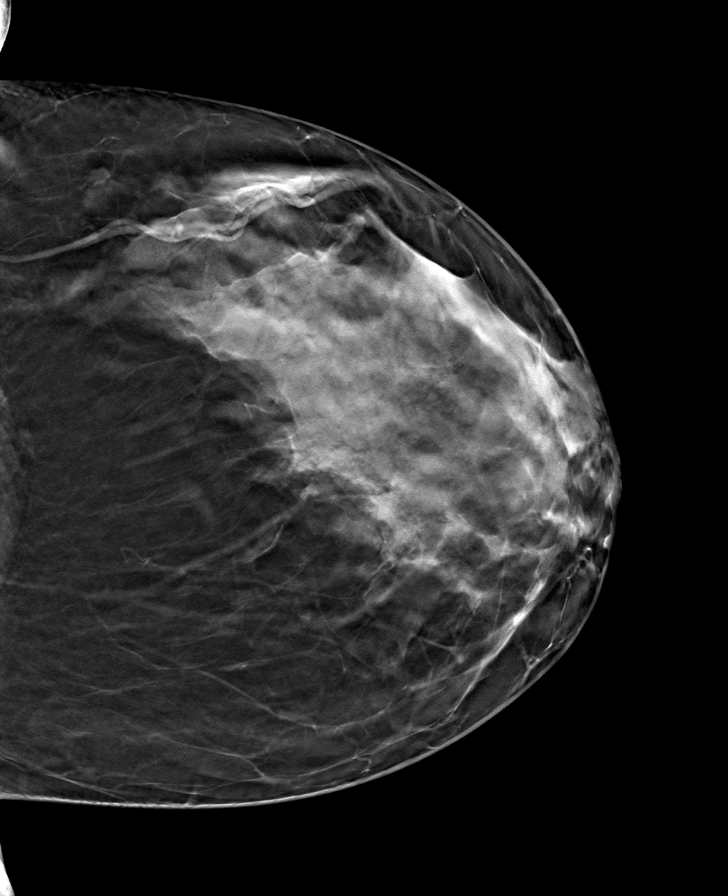

[R CC tomo · tomo slice 35/70.0]
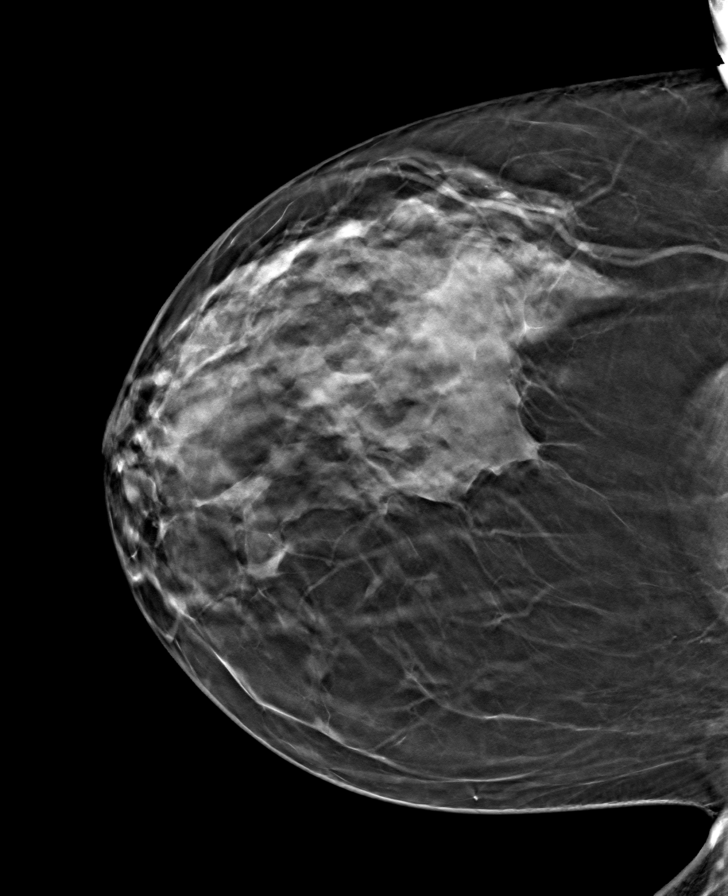

[8 of 24 positions shown; findings below may reference images not displayed]

ACR Breast Density Category c: The breast tissue is heterogeneously
dense, which may obscure small masses.
FINDINGS: There are no findings suspicious for malignancy.
IMPRESSION: No mammographic evidence of malignancy. A result letter of this
screening mammogram will be mailed directly to the patient.

RECOMMENDATION:
Screening mammogram in one year. (Code:Q3-W-BC3)

BI-RADS CATEGORY  1: Negative.

## 2022-08-05 DIAGNOSIS — D225 Melanocytic nevi of trunk: Secondary | ICD-10-CM | POA: Diagnosis not present

## 2022-08-05 DIAGNOSIS — L821 Other seborrheic keratosis: Secondary | ICD-10-CM | POA: Diagnosis not present

## 2022-08-05 DIAGNOSIS — L719 Rosacea, unspecified: Secondary | ICD-10-CM | POA: Diagnosis not present

## 2022-08-05 DIAGNOSIS — L814 Other melanin hyperpigmentation: Secondary | ICD-10-CM | POA: Diagnosis not present

## 2022-09-08 DIAGNOSIS — M62830 Muscle spasm of back: Secondary | ICD-10-CM | POA: Diagnosis not present

## 2022-09-08 DIAGNOSIS — M47816 Spondylosis without myelopathy or radiculopathy, lumbar region: Secondary | ICD-10-CM | POA: Diagnosis not present

## 2022-09-08 DIAGNOSIS — Z0000 Encounter for general adult medical examination without abnormal findings: Secondary | ICD-10-CM | POA: Diagnosis not present

## 2022-10-06 DIAGNOSIS — E782 Mixed hyperlipidemia: Secondary | ICD-10-CM | POA: Diagnosis not present

## 2023-04-29 ENCOUNTER — Other Ambulatory Visit: Payer: Self-pay | Admitting: Physician Assistant

## 2023-04-29 DIAGNOSIS — Z1231 Encounter for screening mammogram for malignant neoplasm of breast: Secondary | ICD-10-CM

## 2023-05-05 ENCOUNTER — Ambulatory Visit
Admission: RE | Admit: 2023-05-05 | Discharge: 2023-05-05 | Disposition: A | Discharge status: Home / Self Care | Payer: BC Managed Care – PPO | Source: Ambulatory Visit | Attending: Physician Assistant | Admitting: Physician Assistant

## 2023-05-05 DIAGNOSIS — Z1231 Encounter for screening mammogram for malignant neoplasm of breast: Secondary | ICD-10-CM

## 2023-05-09 ENCOUNTER — Other Ambulatory Visit: Payer: Self-pay | Admitting: Physician Assistant

## 2023-05-09 DIAGNOSIS — R928 Other abnormal and inconclusive findings on diagnostic imaging of breast: Secondary | ICD-10-CM

## 2023-05-14 ENCOUNTER — Ambulatory Visit
Admission: RE | Admit: 2023-05-14 | Discharge: 2023-05-14 | Disposition: A | Discharge status: Home / Self Care | Payer: BC Managed Care – PPO | Source: Ambulatory Visit | Attending: Physician Assistant | Admitting: Physician Assistant

## 2023-05-14 ENCOUNTER — Other Ambulatory Visit: Payer: Self-pay | Admitting: Physician Assistant

## 2023-05-14 DIAGNOSIS — N6489 Other specified disorders of breast: Secondary | ICD-10-CM

## 2023-05-14 DIAGNOSIS — R928 Other abnormal and inconclusive findings on diagnostic imaging of breast: Secondary | ICD-10-CM

## 2023-11-18 ENCOUNTER — Ambulatory Visit
Admission: RE | Admit: 2023-11-18 | Discharge: 2023-11-18 | Disposition: A | Discharge status: Home / Self Care | Payer: BC Managed Care – PPO | Source: Ambulatory Visit | Attending: Physician Assistant | Admitting: Physician Assistant

## 2023-11-18 ENCOUNTER — Other Ambulatory Visit: Payer: Self-pay | Admitting: Physician Assistant

## 2023-11-18 DIAGNOSIS — R928 Other abnormal and inconclusive findings on diagnostic imaging of breast: Secondary | ICD-10-CM

## 2023-11-18 DIAGNOSIS — N6320 Unspecified lump in the left breast, unspecified quadrant: Secondary | ICD-10-CM

## 2023-11-18 DIAGNOSIS — N6489 Other specified disorders of breast: Secondary | ICD-10-CM
